# Patient Record
Sex: Female | Born: 1970 | Race: White | Hispanic: No | Marital: Married | Smoking: Former smoker
Health system: Southern US, Community
[De-identification: ages and names within clinical notes are randomized; demographics above are authoritative.]

## PROBLEM LIST (undated history)

## (undated) DIAGNOSIS — K5900 Constipation, unspecified: Secondary | ICD-10-CM

## (undated) DIAGNOSIS — R519 Headache, unspecified: Secondary | ICD-10-CM

## (undated) DIAGNOSIS — F419 Anxiety disorder, unspecified: Secondary | ICD-10-CM

## (undated) DIAGNOSIS — M722 Plantar fascial fibromatosis: Secondary | ICD-10-CM

## (undated) DIAGNOSIS — L719 Rosacea, unspecified: Secondary | ICD-10-CM

## (undated) HISTORY — PX: BACK SURGERY: SHX140

## (undated) HISTORY — DX: Headache, unspecified: R51.9

## (undated) HISTORY — DX: Constipation, unspecified: K59.00

## (undated) HISTORY — DX: Plantar fascial fibromatosis: M72.2

## (undated) HISTORY — PX: ANTERIOR FUSION LUMBAR SPINE: SUR629

## (undated) HISTORY — DX: Rosacea, unspecified: L71.9

## (undated) HISTORY — PX: COLONOSCOPY W/ POLYPECTOMY: SHX1380

## (undated) HISTORY — DX: Anxiety disorder, unspecified: F41.9

---

## 2013-10-22 ENCOUNTER — Ambulatory Visit (INDEPENDENT_AMBULATORY_CARE_PROVIDER_SITE_OTHER): Payer: Managed Care, Other (non HMO)

## 2013-10-22 VITALS — BP 143/82 | HR 86 | Resp 16 | Ht 68.0 in | Wt 158.0 lb

## 2013-10-22 DIAGNOSIS — M779 Enthesopathy, unspecified: Secondary | ICD-10-CM

## 2013-10-22 DIAGNOSIS — G5760 Lesion of plantar nerve, unspecified lower limb: Secondary | ICD-10-CM

## 2013-10-22 DIAGNOSIS — M722 Plantar fascial fibromatosis: Secondary | ICD-10-CM

## 2013-10-22 DIAGNOSIS — G609 Hereditary and idiopathic neuropathy, unspecified: Secondary | ICD-10-CM

## 2013-10-22 DIAGNOSIS — G629 Polyneuropathy, unspecified: Secondary | ICD-10-CM

## 2013-10-22 NOTE — Patient Instructions (Signed)

## 2013-10-22 NOTE — Progress Notes (Signed)
   Subjective:    Patient ID: Natalie Daniels, female    DOB: February 24, 1971, 43 y.o.   MRN: [id number]  HPI Comments: "I have this pain my in my foot"  Patient c/o sharp, electric-type pain plantar forefoot right, around 2nd MPJ, for about 1 month. The pain comes and goes. Can't walk barefoot. She has tried ice, Advil and there is a certain pair of shoes she can wear.     Review of Systems  HENT: Positive for sneezing.   All other systems reviewed and are negative.      Objective:   Physical Exam A objective findings as follows vascular status is intact pulses are palpable epicritic and proprioceptive sensations intact and symmetric bilateral there is normal plantar response and DTRs noted dermatologic the skin color pigment normal hair growth absent nails normal trophic. Orthopedic biomechanical exam there is a long second toe long second metatarsal noted radiographically. Epicritic and proprioceptive sensations are intact however there is hyperesthesia to the foot and there is generalize weakness to the toes she is unable to plantar flex her toes slightly dorsiflexed toes although right side is weaker than left patient had history 2 back surgeries and back problems which may contribute to peripheral neuropathy and weakness and neuritis are there is also pain on direct compression of the MTP joint and interspace second interspace right is exquisitely painful and tender on direct lateral compression consistent with early neuroma symptomology there is some capsulitis of the second MTP joint however is also significant pain on palpation the entire mid band and mid arch the plantar fascia right as well dermatologically unremarkable no other abnormal findings radiographically noted       Assessment & Plan:  Assessment this time is capsulitis second MTP joint with early neuroma symptomology second interspace right as was plantar fascial symptomology. However patient does have some digital deformities  and contractures due to possible muscle weakness and peripheral neuropathy associated with her a lumbar radiculopathy or back problems in the past. At this time injection tender with Kenalog in 20 oh Xylocaine plain infiltrated as a therapeutic nerve block for the Morton's neuroma second interspace fascial strapping applied to the right foot and patient will maintain ibuprofen anti-inflammatory is suggested ice to the heel and arch maintain a good stable shoes no barefoot or flimsy shoes or flip-flops reappointed 2-3 weeks may be candidate for orthoses based on progress  Alvan Dameichard Sikora DPM

## 2013-11-12 ENCOUNTER — Ambulatory Visit (INDEPENDENT_AMBULATORY_CARE_PROVIDER_SITE_OTHER): Payer: Managed Care, Other (non HMO)

## 2013-11-12 VITALS — BP 122/80 | HR 88 | Resp 16

## 2013-11-12 DIAGNOSIS — G629 Polyneuropathy, unspecified: Secondary | ICD-10-CM

## 2013-11-12 DIAGNOSIS — M779 Enthesopathy, unspecified: Secondary | ICD-10-CM

## 2013-11-12 DIAGNOSIS — G609 Hereditary and idiopathic neuropathy, unspecified: Secondary | ICD-10-CM

## 2013-11-12 DIAGNOSIS — G5760 Lesion of plantar nerve, unspecified lower limb: Secondary | ICD-10-CM

## 2013-11-12 DIAGNOSIS — M722 Plantar fascial fibromatosis: Secondary | ICD-10-CM

## 2013-11-12 NOTE — Progress Notes (Signed)
   Subjective:    Patient ID: Natalie Daniels, female    DOB: Jan 14, 1971, 43 y.o.   MRN: [id number]  HPI Comments: A lot better compared to when i first came in. It stills hurts with pressure on the forefoot      Review of Systems no new findings or systemic changes noted     Objective:   Physical Exam Neurovascular status is intact and unchanged pedal pedal pulses are palpable patient's injection present temporary relief but his back carrying not as bad as it had been originally the plantar fascial area improvement fascial strapping therefore would benefit from functional orthoses at this time however continues to capsulitis and neuroma symptomology the forefoot with semirigid digital contractures. No new changes the toes contracted dorsally and might benefit from functional orthoses with met pad to       Assessment & Plan:  Assessment plantar fasciitis/heel spur syndrome as well as capsulitis and Morton's neuroma symptomology improvement although not completely resolved would benefit from biomechanical support using orthoses orthotics skin carried out at this time for a functional orthoses with a met pad follow and use a Spenco top cover followup with him next month orthotic fitting and pick up and adjustments orthotic in the future as needed if fails to improve neuropsychology may warrant further injections either steroids or alcohol sclerosing injections based on progress  Harriet Masson DPM

## 2013-11-12 NOTE — Patient Instructions (Signed)

## 2013-12-03 ENCOUNTER — Ambulatory Visit (INDEPENDENT_AMBULATORY_CARE_PROVIDER_SITE_OTHER): Payer: Managed Care, Other (non HMO) | Admitting: *Deleted

## 2013-12-03 DIAGNOSIS — G609 Hereditary and idiopathic neuropathy, unspecified: Secondary | ICD-10-CM

## 2013-12-03 DIAGNOSIS — G629 Polyneuropathy, unspecified: Secondary | ICD-10-CM

## 2013-12-03 NOTE — Progress Notes (Signed)
   Subjective:    Patient ID: Natalie Daniels, female    DOB: 1970/12/06, 43 y.o.   MRN: [id number]  HPI  PICK UP ORTHOTICS AND GIVEN INSTRUCTION.    Review of Systems     Objective:   Physical Exam        Assessment & Plan:

## 2013-12-03 NOTE — Patient Instructions (Signed)

## 2013-12-06 ENCOUNTER — Other Ambulatory Visit: Payer: Managed Care, Other (non HMO)

## 2014-01-28 ENCOUNTER — Ambulatory Visit (INDEPENDENT_AMBULATORY_CARE_PROVIDER_SITE_OTHER): Payer: Managed Care, Other (non HMO)

## 2014-01-28 VITALS — BP 149/88 | HR 75 | Resp 15

## 2014-01-28 DIAGNOSIS — G5760 Lesion of plantar nerve, unspecified lower limb: Secondary | ICD-10-CM

## 2014-01-28 DIAGNOSIS — G5761 Lesion of plantar nerve, right lower limb: Secondary | ICD-10-CM

## 2014-01-28 DIAGNOSIS — M722 Plantar fascial fibromatosis: Secondary | ICD-10-CM

## 2014-01-28 DIAGNOSIS — G629 Polyneuropathy, unspecified: Secondary | ICD-10-CM

## 2014-01-28 DIAGNOSIS — M779 Enthesopathy, unspecified: Secondary | ICD-10-CM

## 2014-01-28 DIAGNOSIS — G609 Hereditary and idiopathic neuropathy, unspecified: Secondary | ICD-10-CM

## 2014-01-28 NOTE — Progress Notes (Signed)
   Subjective:    Patient ID: Natalie Daniels, female    DOB: 09-Mar-1971, 43 y.o.   MRN: [id number]  HPI Comments: Pt states she has worn her orthotics every waking moment since she got them and she is some better.  Pt is very concerned that she can't exercise.      Review of Systems no new findings or systemic changes     Objective:   Physical Exam Neurovascular status is intact pedal pulses palpable patient still having some occasional symptoms but not near as bad as it had been previously been wearing orthotics for about a month may have to still some slight tenderness occasionally in general greatly improved ambulating comfortably still not able do his in the classes ballistic activities however general walking and other activities and doing well the insoles fit and contour well       Assessment & Plan:  Assessment improving plantar fasciitis/heel spur syndrome as well as neuroma symptomology with biomechanical functional sports maintain orthotics continue with ice periodically or NSAIDs for any flareups or exacerbations patient is improved although not complete advised it may take anywhere from 1-3 months for complete resolution and improvement contact us on an as-needed basis if fails to resolve completely or for any adjustments or new orthoses in the future  Alvan Dameichard Dyland Panuco DPM

## 2014-01-28 NOTE — Patient Instructions (Signed)

## 2014-04-18 ENCOUNTER — Encounter: Payer: Self-pay | Admitting: Interventional Cardiology

## 2014-04-18 ENCOUNTER — Ambulatory Visit (INDEPENDENT_AMBULATORY_CARE_PROVIDER_SITE_OTHER): Payer: Managed Care, Other (non HMO) | Admitting: Interventional Cardiology

## 2014-04-18 VITALS — BP 140/84 | HR 83 | Ht 68.0 in | Wt 164.1 lb

## 2014-04-18 DIAGNOSIS — R079 Chest pain, unspecified: Secondary | ICD-10-CM

## 2014-04-18 NOTE — Progress Notes (Signed)
Patient ID: Natalie Daniels, female   DOB: [birth year], 43 y.o.   MRN: [id number]   Date: 04/18/2014 ID: Natalie MassedMelissa Daniels, DOB [birth year], MRN [id number] PCP: Warrick ParisianSTALLINGS,SHEILA, MD  Reason: chest pain  ASSESSMENT;  1. Brief pleuritic quality chest pain, has not recurred. Probably a pleural cath should 2. Labile blood pressure elevation, with borderline elevation today for age. 3. Minimal lipid elevation 4. Family history of CAD (paternal  PLAN:  1. Primary prevention with aerobic exercise, prudent diet, weight loss 2. If recurrent chest discomfort further evaluation with an echocardiogram should be considered to rule out pericardial disease 3. Clinical observation for now 4. Monitor blood pressure closely   SUBJECTIVE: Natalie Daniels is a 43 y.o. female who is for cardiac evaluation because of 2 episodes lasting less than a minute of severe pleuritic chest discomfort in the sternal area. Each episode occurred spontaneously and went away abruptly. While present in the discomfort kept the patient from taking a deep breath. Try to take a deep breath would markedly increase the intensity of the discomfort. There was no radiation of the discomfort. She denied dyspnea. No palpitations. Once the discomfort resolved she was able to get back and her typical activity without limitations. She has been active in [employer] both performed and after these episodes of discomfort and notices no change in her activity/tolerance.   Allergies  Allergen Reactions  . Aspirin Nausea And Vomiting  . Doxycycline Hives    Current Outpatient Prescriptions on File Prior to Visit  Medication Sig Dispense Refill  . Ascorbic Acid (VITAMIN C PO) Take by mouth.    . Cholecalciferol (VITAMIN D PO) Take 500 mg by mouth 3 (three) times daily.     Tery Sanfilippo. Docusate Calcium (STOOL SOFTENER PO) Take by mouth.    . Drospirenone-Ethinyl Estradiol (OCELLA PO) Take by mouth.    . Fexofenadine HCl (ALLEGRA PO) Take by mouth.    . Fluconazole  (DIFLUCAN PO) Take by mouth.    . Multiple Vitamin (MULTIVITAMIN) capsule Take 1 capsule by mouth daily.    . Omega-3 Fatty Acids (FISH OIL PO) Take by mouth.    . Probiotic Product (PROBIOTIC DAILY PO) Take by mouth.     No current facility-administered medications on file prior to visit.    Past Medical History  Diagnosis Date  . Rosacea     Past Surgical History  Procedure Laterality Date  . Anterior fusion lumbar spine    . Cesarean section    . Back surgery      History   Social History  . Marital Status: Married    Spouse Name: N/A    Number of Children: N/A  . Years of Education: N/A   Occupational History  . Not on file.   Social History Main Topics  . Smoking status: Never Smoker   . Smokeless tobacco: Not on file  . Alcohol Use: Yes  . Drug Use: Not on file  . Sexual Activity: Not on file   Other Topics Concern  . Not on file   Social History Narrative    Family History  Problem Relation Age of Onset  . Heart attack Father   . Heart attack Paternal Aunt   . Heart attack Paternal Grandfather     ROS: no orthopnea, PND, palpitations, neurological complaints, history of heart murmur, edema, or embolism, arrhythmia, or other complaint. Other systems negative for complaints.  OBJECTIVE: BP 140/84 mmHg  Pulse 83  Ht 5\' 8"  (1.727 m)  Wt 164 lb 1.9  oz (74.444 kg)  BMI 24.96 kg/m2,  General: No acute distress, healthy-appearing HEENT: normal no jaundice or pallor Neck: JVD flat. Carotids absent Chest: clear Cardiac: Murmur: absent. Gallop: none. Rhythm: normal. Other: none Abdomen: Bruit: absent. Pulsation: absent Extremities: Edema: normal. Pulses: 2+ and symmetric Neuro: normal Psych: normal  ECG: normal

## 2014-04-18 NOTE — Patient Instructions (Signed)
Your physician recommends that you continue on your current medications as directed. Please refer to the Current Medication list given to you today.   Your physician recommends that you schedule a follow-up appointment as needed  

## 2015-04-04 DIAGNOSIS — M722 Plantar fascial fibromatosis: Secondary | ICD-10-CM

## 2015-04-21 ENCOUNTER — Encounter: Payer: Self-pay | Admitting: *Deleted

## 2015-04-21 NOTE — Progress Notes (Signed)
Patient ID: Natalie MassedMelissa Blane, female   DOB: 22-Feb-1971, 44 y.o.   MRN: [id number] Patient came into office to pick up her 2nd pair of orthotics.

## 2015-11-17 DIAGNOSIS — J309 Allergic rhinitis, unspecified: Secondary | ICD-10-CM | POA: Insufficient documentation

## 2015-11-17 DIAGNOSIS — R42 Dizziness and giddiness: Secondary | ICD-10-CM | POA: Insufficient documentation

## 2015-11-17 DIAGNOSIS — Z87898 Personal history of other specified conditions: Secondary | ICD-10-CM | POA: Insufficient documentation

## 2015-11-28 ENCOUNTER — Other Ambulatory Visit: Payer: Self-pay | Admitting: Obstetrics & Gynecology

## 2015-11-28 DIAGNOSIS — R599 Enlarged lymph nodes, unspecified: Secondary | ICD-10-CM

## 2015-12-05 ENCOUNTER — Ambulatory Visit
Admission: RE | Admit: 2015-12-05 | Discharge: 2015-12-05 | Disposition: A | Discharge status: Home / Self Care | Payer: Managed Care, Other (non HMO) | Source: Ambulatory Visit | Attending: Obstetrics & Gynecology | Admitting: Obstetrics & Gynecology

## 2015-12-05 DIAGNOSIS — R599 Enlarged lymph nodes, unspecified: Secondary | ICD-10-CM

## 2016-04-01 LAB — HM PAP SMEAR: HM PAP: NEGATIVE

## 2016-09-27 ENCOUNTER — Encounter: Payer: Self-pay | Admitting: Podiatry

## 2016-09-27 ENCOUNTER — Ambulatory Visit (INDEPENDENT_AMBULATORY_CARE_PROVIDER_SITE_OTHER): Payer: 59

## 2016-09-27 ENCOUNTER — Ambulatory Visit (INDEPENDENT_AMBULATORY_CARE_PROVIDER_SITE_OTHER): Payer: 59 | Admitting: Podiatry

## 2016-09-27 DIAGNOSIS — M779 Enthesopathy, unspecified: Secondary | ICD-10-CM | POA: Diagnosis not present

## 2016-09-27 MED ORDER — DICLOFENAC SODIUM 75 MG PO TBEC: 75.0000 mg | DELAYED_RELEASE_TABLET | Freq: Two times a day (BID) | ORAL | 2 refills | Status: DC

## 2016-09-27 MED ORDER — TRIAMCINOLONE ACETONIDE 10 MG/ML IJ SUSP
10.0000 mg | Freq: Once | INTRAMUSCULAR | Status: AC
  Administered 2016-09-27: 10 mg

## 2016-09-29 NOTE — Progress Notes (Signed)
Subjective:     Patient ID: Natalie Daniels, female   DOB: Feb 15, 1971, 46 y.o.   MRN: [id number]  HPI patient presents with severe discomfort second MPJ left stating that it started to hurt her recently and she's not sure what might have happened   Review of Systems  All other systems reviewed and are negative.      Objective:   Physical Exam  Constitutional: She is oriented to person, place, and time.  Cardiovascular: Intact distal pulses.   Musculoskeletal: Normal range of motion.  Neurological: She is oriented to person, place, and time.  Skin: Skin is warm.  Nursing note and vitals reviewed.  neurovascular status intact muscle strength adequate range of motion within normal limits with inflammation fluid buildup around the second metatarsophalangeal joint left with pain when present and inability to walk with degree of comfort. Patient's found have good digital perfusion and is well oriented 3     Assessment:     Acute inflammatory capsulitis second MPJ left which may be due to injury with possibility for flexor plate injury    Plan:     H&P x-ray reviewed condition discussed. At this time we're to treat conservatively and I did a proximal nerve block of the area aspirated the joint was able to get out a small amount of clear fluid and I injected the joint quarter cc deck some Kenalog and applied thick pad to reduce pressure on the joint surface. Reappoint and understands ultimately this could be a surgical issue  X-rays did not indicate arthritis or stress fracture of the joint surface

## 2016-10-02 ENCOUNTER — Telehealth: Payer: Self-pay | Admitting: *Deleted

## 2016-10-02 NOTE — Telephone Encounter (Addendum)
Pt states Dr. Charlsie Merles performed a procedure on her foot 09/27/2016 and said it should be better in 24 hours, but she still has pain, behind the toes and ball of the foot. I told pt is most cases she would be better in 24 hours, but that depends on how inflamed the area was and how she was taking care of at home. I encouraged pt to ice 3-4 times daily for 15-20 minutes per session protecting the skin from the ice pack with fabric and to go in to a stiff bottom shoe to decrease the bending and continued irritation to the area. Pt states she is wearing the [employer] shoes Dr. Ralene Cork has said were better than some others he had seen. I asked pt if they were the same ones Dr. Ralene Cork had seen and she said yes. I told pt Dr. Ralene Cork had been gone about 3 years and they were probably too flexible for her needs now. Pt states understanding. I told pt to try the suggestions the rest of the week and weekend and let us know Monday if improved and if worsened call sooner.

## 2016-10-11 ENCOUNTER — Ambulatory Visit (INDEPENDENT_AMBULATORY_CARE_PROVIDER_SITE_OTHER): Payer: 59 | Admitting: Podiatry

## 2016-10-11 ENCOUNTER — Encounter: Payer: Self-pay | Admitting: Podiatry

## 2016-10-11 DIAGNOSIS — M779 Enthesopathy, unspecified: Secondary | ICD-10-CM

## 2016-10-11 MED ORDER — PREDNISONE 10 MG PO TABS: ORAL_TABLET | ORAL | 0 refills | Status: DC

## 2016-10-14 NOTE — Progress Notes (Signed)
Subjective:    Patient ID: Natalie Daniels, female   DOB: 46 y.o.   MRN: [id number]   HPI patient states she's improved some but continues to have discomfort on the left foot where she had a chronic inflammation of the joint    ROS      Objective:  Physical Exam Neurovascular status was noted to be intact with patient found to have inflammatory changes around the second MPJ left with fluid buildup around the joint and pain when palpated. Patient states that it's gradually gotten worse over that period of time and it's making it difficult for her to walk and while she got relief for the medication we utilized she still is having pain and prominence metatarsal    Assessment:  Inflammatory capsulitis of the second MPJ left secondary to probable dropping of the metatarsal with fluid buildup in the joint surface       Plan:     H&P condition reviewed. I recommended specific type orthotics and I did schedule her to see [facility] orthotist for orthotic treatment

## 2016-10-16 ENCOUNTER — Other Ambulatory Visit: Payer: 59

## 2016-10-17 ENCOUNTER — Other Ambulatory Visit: Payer: 59

## 2016-10-17 DIAGNOSIS — M7750 Other enthesopathy of unspecified foot: Secondary | ICD-10-CM

## 2016-11-07 ENCOUNTER — Other Ambulatory Visit: Payer: 59

## 2017-04-28 LAB — HM MAMMOGRAPHY

## 2017-07-03 ENCOUNTER — Ambulatory Visit (INDEPENDENT_AMBULATORY_CARE_PROVIDER_SITE_OTHER): Payer: 59

## 2017-07-03 ENCOUNTER — Ambulatory Visit (INDEPENDENT_AMBULATORY_CARE_PROVIDER_SITE_OTHER): Payer: 59 | Admitting: Physician Assistant

## 2017-07-03 ENCOUNTER — Encounter: Payer: Self-pay | Admitting: Physician Assistant

## 2017-07-03 VITALS — BP 130/82 | HR 83 | Ht 66.0 in | Wt 166.0 lb

## 2017-07-03 DIAGNOSIS — G8929 Other chronic pain: Secondary | ICD-10-CM | POA: Insufficient documentation

## 2017-07-03 DIAGNOSIS — Z23 Encounter for immunization: Secondary | ICD-10-CM | POA: Diagnosis not present

## 2017-07-03 DIAGNOSIS — Z87891 Personal history of nicotine dependence: Secondary | ICD-10-CM | POA: Insufficient documentation

## 2017-07-03 DIAGNOSIS — Z7689 Persons encountering health services in other specified circumstances: Secondary | ICD-10-CM | POA: Diagnosis not present

## 2017-07-03 DIAGNOSIS — M25561 Pain in right knee: Secondary | ICD-10-CM | POA: Diagnosis not present

## 2017-07-03 NOTE — Progress Notes (Signed)
[name],  Your x-ray was normal. I still recommend follow-up with Sports Medicine for your persistent knee pain.  It was nice meeting you! Vinetta Bergamoharley

## 2017-07-03 NOTE — Progress Notes (Signed)
HPI:                                                                Natalie Daniels is a 47 y.o. female [facility] Sports Medicine today to establish care  Current concerns include: right knee pain  Reports fell on her right knee in August, 5 months ago. Landed on her hardwood floor at home, had immediate bruising, swelling and pain.  States it took a couple of months to get better, but never returned to normal. States she was never able to return to [employer] and exercise classes. In the last month she re-injured it striking against a sliding door. Pain has returned, moderate, persistent worse with bending and squatting. She has tried Pentrax, Advil, ice/heat without much relief.  Depression screen PHQ 2/9 07/03/2017  Decreased Interest 0  Down, Depressed, Hopeless 0  PHQ - 2 Score 0    No flowsheet data found.    Past Medical History:  Diagnosis Date  . Constipation   . Plantar fasciitis   . Rosacea    Past Surgical History:  Procedure Laterality Date  . ANTERIOR FUSION LUMBAR SPINE    . BACK SURGERY    . CESAREAN SECTION    . COLONOSCOPY W/ POLYPECTOMY     age 47   Social History   Tobacco Use  . Smoking status: Former Smoker    Packs/day: 1.00    Years: 8.00    Pack years: 8.00    Types: Cigarettes    Last attempt to quit: 06/17/1994    Years since quitting: 23.0  . Smokeless tobacco: Never Used  Substance Use Topics  . Alcohol use: Yes    Comment: <1 month   family history includes Alcohol abuse in her father; Diabetes in her father; Endometriosis in her mother; Heart attack in her father, paternal aunt, and paternal grandfather; Stroke in her maternal grandfather; Thyroid disease in her mother.    ROS: negative except as noted in the HPI  Medications: Current Outpatient Medications  Medication Sig Dispense Refill  . Ascorbic Acid (VITAMIN C PO) Take by mouth.    Tery Sanfilippo. Docusate Calcium (STOOL SOFTENER PO) Take by  mouth.    . Drospirenone-Ethinyl Estradiol (OCELLA PO) Take by mouth.    . metroNIDAZOLE (METROGEL) 1 % gel metronidazole 1 % topical gel    . Multiple Vitamin (MULTIVITAMIN) capsule Take 1 capsule by mouth daily.    . Omega-3 Fatty Acids (FISH OIL PO) Take by mouth.    . Probiotic Product (PROBIOTIC DAILY PO) Take by mouth.     No current facility-administered medications for this visit.    Allergies  Allergen Reactions  . Aspirin Nausea And Vomiting  . Diclofenac Hives and Itching  . Doxycycline Hives       Objective:  BP 130/82   Pulse 83   Ht 5\' 6"  (1.676 m)   Wt 166 lb (75.3 kg)   SpO2 100%   BMI 26.79 kg/m  Gen:  alert, not ill-appearing, no distress, appropriate for age HEENT: head normocephalic without obvious abnormality, conjunctiva and cornea clear, wearing glasses, trachea midline Pulm: Normal work of breathing, normal phonation Neuro: alert and oriented x 3, no tremor MSK: right knee atraumatic, mild  swelling of the lateral joint line, full active ROM, no crepitus, no effusion, joint stable, negative anterior drawer; normal gait and station Skin: intact, no rashes on exposed skin, no jaundice, no cyanosis Psych: well-groomed, cooperative, good eye contact, euthymic mood, affect mood-congruent, speech is articulate, and thought processes clear and goal-directed    No results found for this or any previous visit (from the past 72 hour(s)). No results found.    Assessment and Plan: 47 y.o. female with   1. Encounter to establish care - reviewed PMH, PSH, PFH, medications and allergies - reviewed health maintenance - Pap smear and Mammogram UTD per patient, requesting records from [facility] - Tdap due - influenza UTD - negative PHQ2  2. Chronic pain of right knee - persistent knee pain after mild trauma, not responsive to NSAIDs or rest. Obtaining X-ray today and referring to Sports Medicine for further evaluation and management - DG Knee Complete 4  Views Right  3. Need for Tdap vaccination - Tdap vaccine greater than or equal to 7yo IM  Patient education and anticipatory guidance given Patient agrees with treatment plan Follow-up as needed if symptoms worsen or fail to improve  Levonne Hubert PA-C

## 2017-07-08 ENCOUNTER — Ambulatory Visit (INDEPENDENT_AMBULATORY_CARE_PROVIDER_SITE_OTHER): Payer: 59 | Admitting: Family Medicine

## 2017-07-08 ENCOUNTER — Encounter: Payer: Self-pay | Admitting: Family Medicine

## 2017-07-08 VITALS — BP 136/86 | HR 84 | Ht 68.0 in | Wt 165.0 lb

## 2017-07-08 DIAGNOSIS — G8929 Other chronic pain: Secondary | ICD-10-CM

## 2017-07-08 DIAGNOSIS — M25561 Pain in right knee: Secondary | ICD-10-CM

## 2017-07-08 DIAGNOSIS — M222X1 Patellofemoral disorders, right knee: Secondary | ICD-10-CM | POA: Diagnosis not present

## 2017-07-08 MED ORDER — DICLOFENAC SODIUM 1 % TD GEL: 4.0000 g | Freq: Four times a day (QID) | TRANSDERMAL | 11 refills | Status: DC

## 2017-07-08 NOTE — Progress Notes (Signed)
Subjective:    I'm seeing this patient as a consultation for:  Natalie Fray PA-C  CC: R knee pain  HPI:  Patient with 1 1/2 year history of right knee pain. Patient fell on knee 1.5 years ago and has since had dull achy pain since that time. Pain is worse with both sitting in one position, such as when driving care, and with exertion. Pain is worse after day of activity. Worsens with walking down steps or off cub. Occasional shooting pain at night. Patient has tried advil with minimal relief. Recent Xray of knee shows no DJD, joint space narrowing, or bony abnormality. Patient goal of care is to return to fitness class, which she has not been able to participate in since injury.   Past medical history, Surgical history, Family history not pertinant except as noted below, Social history, Allergies, and medications have been entered into the medical record, reviewed, and no changes needed.   Review of Systems: No headache, visual changes, nausea, vomiting, diarrhea, constipation, dizziness, abdominal pain, skin rash, fevers, chills, night sweats, weight loss, swollen lymph nodes, body aches, joint swelling, muscle aches, chest pain, shortness of breath, mood changes, visual or auditory hallucinations.   Objective:    Vitals:   07/08/17 0920  BP: 136/86  Pulse: 84   General: Well Developed, well nourished, and in no acute distress.  Neuro/Psych: Alert and oriented x3, extra-ocular muscles intact, able to move all 4 extremities, sensation grossly intact. Skin: Warm and dry, no rashes noted.  Respiratory: Not using accessory muscles, speaking in full sentences, trachea midline.  Cardiovascular: Pulses palpable, no extremity edema. Abdomen: Does not appear distended. MSK:  Right Knee: Normal appearing with no effusion or erythema Range of motion 0-120  Tender to palpation medial joint line.  Tender to palpation overlying the quad and patellar tendons with squeak overlying the distal  quadriceps tendon. Stable ligamentous exam Positive medial McMurray's test Extension strength is slightly decreased 4+/5 compared to left Normal flexion strength Normal gait  CLINICAL DATA:  Trauma to the right knee in August and November of 2018 with persistent right knee pain.  EXAM: RIGHT KNEE - COMPLETE 4+ VIEW  COMPARISON:  None.  FINDINGS: No evidence of fracture, dislocation, or joint effusion. No evidence of arthropathy or other focal bone abnormality. Soft tissues are unremarkable.  IMPRESSION: Negative.   Electronically Signed   By: Sherian Rein M.D.   On: 07/03/2017 13:49  Impression and Recommendations:    Assessment and Plan: 47 y.o. female with 1 1/2 history of right knee pain. Patient seen as consultation of Natalie Daniels, after failure of conservative management. Patient's signs and symptoms most consistent with 1) patellofemoral syndrome of right knee and 2) possible medial meniscus tear.  Patient has had greater than 6 weeks of conservative management.  For the patellofemoral pain, patient with benefit from topical diclofenac gel and strengthening exercises for quadriceps and hip abductors.  For possible meniscus tear, MRI is warranted. Patient has signs and symptoms concerning for meniscus tear including pain along medial joint line and pain along medial joint line with external rotation of foot and ankle.   Orders Placed This Encounter  Procedures  . MR Knee Right Wo Contrast    Standing Status:   Future    Standing Expiration Date:   09/06/2018    Order Specific Question:   What is the patient's sedation requirement?    Answer:   No Sedation    Order Specific Question:  Does the patient have a pacemaker or implanted devices?    Answer:   No    Order Specific Question:   Preferred imaging location?    [facility] (table limit-[facility]lbs[facility]    Order Specific Question:   Radiology Contrast Protocol - do NOT remove file path     Answer:   [email or web address]   Meds ordered this encounter  Medications  . diclofenac sodium (VOLTAREN) 1 % GEL    Sig: Apply 4 g topically 4 (four) times daily. To affected joint.    Dispense:  100 g    Refill:  11    Discussed warning signs or symptoms. Please see discharge instructions. Patient expresses understanding.

## 2017-07-08 NOTE — Patient Instructions (Signed)
Thank you for coming in today. Get MRI soon.  Start straight leg raises, Toe out straight leg raises and side leg raises.  Start using sets of 15 reps 2-3x daily.  Increase to 30 reps then increase to a 2 pound leg weight.  Do both sides.  Recheck with me a few days after the MRI.    Patellar Tendinitis Rehab Ask your health care provider which exercises are safe for you. Do exercises exactly as told by your health care provider and adjust them as directed. It is normal to feel mild stretching, pulling, tightness, or discomfort as you do these exercises, but you should stop right away if you feel sudden pain or your pain gets worse.Do not begin these exercises until told by your health care provider. Stretching and range of motion exercises This exercise warms up your muscles and joints and improves the movement and flexibility of your knee. This exercise also helps to relieve pain and stiffness. Exercise A: Hamstring, doorway  1. Lie on your back in front of a doorway with your __________ leg resting against the wall and your other leg flat on the floor in the doorway. There should be a slight bend in your __________ knee. 2. Straighten your __________ knee. You should feel a stretch behind your knee or thigh. If you do not, scoot your buttocks closer to the door. 3. Hold this position for __________ seconds. Repeat __________ times. Complete this stretch __________ times a day. Strengthening exercises These exercises build strength and endurance in your knee. Endurance is the ability to use your muscles for a long time, even after they get tired. Exercise B: Quadriceps, isometric  1. Lie on your back with your __________ leg extended and your other knee bent. 2. Slowly tense the muscles in the front of your __________ thigh. When you do this, you should see your kneecap slide up toward your hip or see increased dimpling just above the knee. This motion will push the back of your knee  toward the floor. If this is painful, try putting a rolled-up hand towel under your knee to support it in a bent position. Change the size of the towel to find a position that allows you to do this exercise without any pain. 3. For __________ seconds, hold the muscle as tight as you can without increasing your pain. 4. Relax the muscles slowly and completely. Repeat __________ times. Complete this exercise __________ times a day. Exercise C: Straight leg raises ( quadriceps) 1. Lie on your back with your __________ leg extended and your other knee bent. 2. Tense the muscles in the front of your __________ thigh. When you do this, you should see your kneecap slide up or see increased dimpling just above the knee. 3. Keep these muscles tight as you raise your leg 4-6 inches (10-15 cm) off the floor. Do not let your moving knee bend. 4. Hold this position for __________ seconds. 5. Keep these muscles tense as you slowly lower your leg. 6. Relax your muscles slowly and completely. Repeat __________ times. Complete this exercise __________ times a day. Exercise D: Squats 1. Stand in front of a table, with your feet and knees pointing straight ahead. You may rest your hands on the table for balance but not for support. 2. Slowly bend your knees and lower your hips like you are going to sit in a chair. ? Keep your weight over your heels, not over your toes. ? Keep your lower legs upright so  they are parallel with the table legs. ? Do not let your hips go lower than your knees. ? Do not bend lower than told by your health care provider. ? If your knee pain increases, do not bend as low. 3. Hold the squat position for __________ seconds. 4. Slowly push with your legs to return to standing. Do not use your hands to pull yourself to standing. Repeat __________ times. Complete this exercise __________ times a day. Exercise E: Step-downs 1. Stand on the edge of a step. 2. Keeping your weight over your  __________ heel, slowly bend your __________ knee to bring your __________ heel toward the floor. Lower your heel as far as you can while keeping control and without increasing any discomfort. ? Do not let your __________ knee come forward. ? Use your leg muscles, not gravity, to lower your body. ? Hold a wall or rail for balance if needed. 3. Slowly push through your heel to lift your body weight back up. 4. Return to the starting position. Repeat __________ times. Complete this exercise __________ times a day. Exercise F: Straight leg raises ( hip abductors) 1. Lie on your side with your __________ leg in the top position. Lie so your head, shoulder, knee, and hip line up. You may bend your lower knee to help you keep your balance. 2. Roll your hips slightly forward, so that your hips are stacked directly over each other and your __________ knee is facing forward. 3. Leading with your heel, lift your top leg 4-6 inches (10-15 cm). You should feel the muscles in your outer hip lifting. ? Do not let your foot drift forward. ? Do not let your knee roll toward the ceiling. 4. Hold this position for __________ seconds. 5. Slowly lower your leg to the starting position. 6. Let your muscles relax completely after each repetition. Repeat __________ times. Complete this exercise __________ times a day. This information is not intended to replace advice given to you by your health care provider. Make sure you discuss any questions you have with your health care provider. Document Released: 06/03/2005 Document Revised: 02/08/2016 Document Reviewed: 03/07/2015 Elsevier Interactive Patient Education  2018 [employer] Inc.    Meniscus Tear A meniscus tear is a knee injury in which a piece of the meniscus is torn. The meniscus is a thick, rubbery, wedge-shaped cartilage in the knee. Two menisci are located in each knee. They sit between the upper bone (femur) and lower bone (tibia) that make up the knee  joint. Each meniscus acts as a shock absorber for the knee. A torn meniscus is one of the most common types of knee injuries. This injury can range from mild to severe. Surgery may be needed for a severe tear. What are the causes? This injury may be caused by any squatting, twisting, or pivoting movement. Sports-related injuries are the most common cause. These often occur from:  Running and stopping suddenly.  Changing direction.  Being tackled or knocked off your feet.  As people get older, their meniscus gets thinner and weaker. In these people, tears can happen more easily, such as from climbing stairs. What increases the risk? This injury is more likely to happen to:  People who play contact sports.  Males.  People who are 76?47 years of age.  What are the signs or symptoms? Symptoms of this injury include:  Knee pain, especially at the side of the knee joint. You may feel pain when the injury occurs, or you may  only hear a pop and feel pain later.  A feeling that your knee is clicking, catching, locking, or giving way.  Not being able to fully bend or extend your knee.  Bruising or swelling in your knee.  How is this diagnosed? This injury may be diagnosed based on your symptoms and a physical exam. The physical exam may include:  Moving your knee in different ways.  Feeling for tenderness.  Listening for a clicking sound.  Checking if your knee locks or catches.  You may also have tests, such as:  X-rays.  MRI.  A procedure to look inside your knee with a narrow surgical telescope (arthroscopy).  You may be referred to a knee specialist (orthopedic surgeon). How is this treated? Treatment for this injury depends on the severity of the tear. Treatment for a mild tear may include:  Rest.  Medicine to reduce pain and swelling. This is usually a nonsteroidal anti-inflammatory drug (NSAID).  A knee brace or an elastic sleeve or wrap.  Using crutches or a  walker to keep weight off your knee and to help you walk.  Exercises to strengthen your knee (physical therapy).  You may need surgery if you have a severe tear or if other treatments are not working. Follow these instructions at home: Managing pain and swelling  Take over-the-counter and prescription medicines only as told by your health care provider.  If directed, apply ice to the injured area: ? Put ice in a plastic bag. ? Place a towel between your skin and the bag. ? Leave the ice on for 20 minutes, 2-3 times per day.  Raise (elevate) the injured area above the level of your heart while you are sitting or lying down. Activity  Do not use the injured limb to support your body weight until your health care provider says that you can. Use crutches or a walker as told by your health care provider.  Return to your normal activities as told by your health care provider. Ask your health care provider what activities are safe for you.  Perform range-of-motion exercises only as told by your health care provider.  Begin doing exercises to strengthen your knee and leg muscles only as told by your health care provider. After you recover, your health care provider may recommend these exercises to help prevent another injury. General instructions  Use a knee brace or elastic wrap as told by your health care provider.  Keep all follow-up visits as told by your health care provider. This is important. Contact a health care provider if:  You have a fever.  Your knee becomes red, tender, or swollen.  Your pain medicine is not helping.  Your symptoms get worse or do not improve after 2 weeks of home care. This information is not intended to replace advice given to you by your health care provider. Make sure you discuss any questions you have with your health care provider. Document Released: 08/24/2002 Document Revised: 11/09/2015 Document Reviewed: 09/26/2014 Elsevier Interactive Patient  Education  [employer].

## 2017-07-08 NOTE — Progress Notes (Signed)
[facility]

## 2017-07-11 ENCOUNTER — Encounter: Payer: Self-pay | Admitting: Physician Assistant

## 2017-07-14 ENCOUNTER — Ambulatory Visit (INDEPENDENT_AMBULATORY_CARE_PROVIDER_SITE_OTHER): Payer: 59

## 2017-07-14 DIAGNOSIS — G8929 Other chronic pain: Secondary | ICD-10-CM

## 2017-07-14 DIAGNOSIS — M25561 Pain in right knee: Secondary | ICD-10-CM

## 2017-07-29 ENCOUNTER — Ambulatory Visit (INDEPENDENT_AMBULATORY_CARE_PROVIDER_SITE_OTHER): Payer: 59 | Admitting: Family Medicine

## 2017-07-29 ENCOUNTER — Encounter: Payer: Self-pay | Admitting: Family Medicine

## 2017-07-29 DIAGNOSIS — M222X1 Patellofemoral disorders, right knee: Secondary | ICD-10-CM | POA: Diagnosis not present

## 2017-07-29 DIAGNOSIS — M222X9 Patellofemoral disorders, unspecified knee: Secondary | ICD-10-CM | POA: Insufficient documentation

## 2017-07-29 NOTE — Progress Notes (Signed)
Natalie Daniels is a 47 y.o. female who presents to [facility] today for follow up on knee pain.  Patient seen 07/08/17 for 1.5 year history of right knee pain, worsening over the past month. Patient has significantly improved since that time. Patient states that she is using diclofenac gel 4 times per day and it helps the pain significantly. Patient able to ambulate with much less pain than prior. States that pain is 3/10, which is significantly improved from prior. Patient does note that home leg raise exercises were intolerable. She performed them faithfully for several weeks, but the pain became intolerable so she elected to stop. MRI of right knee 1/29 was completely normal, not concerning for medial meniscus injury.   At this point, patient is satisfied with her progress. Wondering if she can return to fitness class. Patient will try to return to exercise on limited basis. Doesn't think she's at the point of needing formal physical therapy.    Past Medical History:  Diagnosis Date  . Constipation   . Plantar fasciitis   . Rosacea    Past Surgical History:  Procedure Laterality Date  . ANTERIOR FUSION LUMBAR SPINE    . BACK SURGERY    . CESAREAN SECTION    . COLONOSCOPY W/ POLYPECTOMY     age 47   Social History   Tobacco Use  . Smoking status: Former Smoker    Packs/day: 1.00    Years: 8.00    Pack years: 8.00    Types: Cigarettes    Last attempt to quit: 06/17/1994    Years since quitting: 23.1  . Smokeless tobacco: Never Used  Substance Use Topics  . Alcohol use: Yes    Comment: <1 month     ROS:  As above   Medications: Current Outpatient Medications  Medication Sig Dispense Refill  . Ascorbic Acid (VITAMIN C PO) Take by mouth.    . diclofenac sodium (VOLTAREN) 1 % GEL Apply 4 g topically 4 (four) times daily. To affected joint. 100 g 11  . Docusate Calcium (STOOL SOFTENER PO) Take by mouth.    .  Drospirenone-Ethinyl Estradiol (OCELLA PO) Take by mouth.    . metroNIDAZOLE (METROGEL) 1 % gel metronidazole 1 % topical gel    . Multiple Vitamin (MULTIVITAMIN) capsule Take 1 capsule by mouth daily.    . Omega-3 Fatty Acids (FISH OIL PO) Take by mouth.    . Probiotic Product (PROBIOTIC DAILY PO) Take by mouth.     No current facility-administered medications for this visit.    Allergies  Allergen Reactions  . Aspirin Nausea And Vomiting  . Diclofenac Hives and Itching  . Doxycycline Hives     Exam:  BP 111/73 (BP Location: Left Arm, Patient Position: Sitting, Cuff Size: Large)   Pulse 84   Temp 99.5 F (37.5 C) (Oral)   Resp 16   Wt 167 lb 4.8 oz (75.9 kg)   SpO2 100%   BMI 25.44 kg/m  General: Well Developed, well nourished, and in no acute distress.  Neuro/Psych: Alert and oriented x3, extra-ocular muscles intact, able to move all 4 extremities, sensation grossly intact. Skin: Warm and dry, no rashes noted.  Respiratory: Not using accessory muscles, speaking in full sentences, trachea midline.  Cardiovascular: Pulses palpable, no extremity edema. Abdomen: Does not appear distended. MSK:  R. Knee: Held in straight position for comfort.  Significant tenderness along quadriceps tendon.  Minimal tenderness along medial joint line.  MRI  images independently reviewed by me CLINICAL DATA:  Chronic right knee pain.  EXAM: MRI OF THE RIGHT KNEE WITHOUT CONTRAST  TECHNIQUE: Multiplanar, multisequence MR imaging of the knee was performed. No intravenous contrast was administered.  COMPARISON:  Radiographs dated 07/03/2017  FINDINGS: MENISCI  Medial meniscus:  Normal.  Lateral meniscus:  Normal.  LIGAMENTS  Cruciates:  Normal.  Collaterals:  Normal.  CARTILAGE  Patellofemoral:  Normal.  Medial:  Normal.  Lateral:  Normal.  Joint:  No joint effusion. Normal Hoffa's fat. No plical thickening.  Popliteal Fossa:  No Baker cyst. Intact  popliteus tendon.  Extensor Mechanism:  Normal.  Bones:  Normal.  Other: None  IMPRESSION: Normal MRI of the right knee.   Electronically Signed   By: Francene Boyers M.D.   On: 07/14/2017 09:14  Assessment and Plan: 47 y.o. female with patellofemoral syndrome. Patient with normal MRI and improving anteromedial knee pain. Pain most consistent with patellofemoral syndrome.   Patient will continue diclofenac gel as it has been extremely helpful. Patient does not wish for formal physical therapy at this point. She can return to fitness class on limited basis, increasing activity as tolerated. If pain return with Zumba next step would be referral to PT.   Patient only needs to return to follow up if not improving or worsening.   I spent 15 minutes with this patient, greater than 50% was face-to-face time counseling regarding ddx and treatment plan and review MRI.  Discussed warning signs or symptoms. Please see discharge instructions. Patient expresses understanding.

## 2017-07-29 NOTE — Patient Instructions (Addendum)
Thank you for coming in today. Continue Diclofenac gel as needed.  If not doing well the next step is PT.  Resume Zumba at about 50% and increase about 10% per week.  Recheck with me as needed.    Patellofemoral Pain Syndrome Patellofemoral pain syndrome is a condition that involves a softening or breakdown of the tissue (cartilage) on the underside of your kneecap (patella). This causes pain in the front of the knee. The condition is also called runner's knee or chondromalacia patella. Patellofemoral pain syndrome is most common in young adults who are active in sports. Your knee is the largest joint in your body. The patella covers the front of your knee and is attached to muscles above and below your knee. The underside of the patella is covered with a smooth type of cartilage (synovium). The smooth surface helps the patella glide easily when you move your knee. Patellofemoral pain syndrome causes swelling in the joint linings and bone surfaces in your knee. What are the causes? Patellofemoral pain syndrome can be caused by:  Overuse.  Poor alignment of your knee joints.  Weak leg muscles.  A direct blow to your kneecap.  What increases the risk? You may be at risk for patellofemoral pain syndrome if you:  Do a lot of activities that can wear down your kneecap. These include: ? Running. ? Squatting. ? Climbing stairs.  Start a new physical activity or exercise program.  Wear shoes that do not fit well.  Do not have good leg strength.  Are overweight.  What are the signs or symptoms? Knee pain is the most common symptom of patellofemoral pain syndrome. This may feel like a dull, aching pain underneath your patella, in the front of your knee. There may be a popping or cracking sound when you move your knee. Pain may get worse with:  Exercise.  Climbing stairs.  Running.  Jumping.  Squatting.  Kneeling.  Sitting for a long time.  Moving or pushing on your  patella.  How is this diagnosed? Your health care provider may be able to diagnose patellofemoral pain syndrome from your symptoms and medical history. You may be asked about your recent physical activities and which ones cause knee pain. Your health care provider may do a physical exam with certain tests to confirm the diagnosis. These may include:  Moving your patella back and forth.  Checking your range of knee motion.  Having you squat or jump to see if you have pain.  Checking the strength of your leg muscles.  An MRI of the knee may also be done. How is this treated? Patellofemoral pain syndrome can usually be treated at home with rest, ice, compression, and elevation (RICE). Other treatments may include:  Nonsteroidal anti-inflammatory drugs (NSAIDs).  Physical therapy to stretch and strengthen your leg muscles.  Shoe inserts (orthotics) to take stress off your knee.  A knee brace or knee support.  Surgery to remove damaged cartilage or move the patella to a better position. The need for surgery is rare.  Follow these instructions at home:  Take medicines only as directed by your health care provider.  Rest your knee. ? When resting, keep your knee raised above the level of your heart. ? Avoid activities that cause knee pain.  Apply ice to the injured area: ? Put ice in a plastic bag. ? Place a towel between your skin and the bag. ? Leave the ice on for 20 minutes, 2-3 times a day.  Use splints, braces, knee supports, or walking aids as directed by your health care provider.  Perform stretching and strengthening exercises as directed by your health care provider or physical therapist.  Keep all follow-up visits as directed by your health care provider. This is important. Contact a health care provider if:  Your symptoms get worse.  You are not improving with home care. This information is not intended to replace advice given to you by your health care  provider. Make sure you discuss any questions you have with your health care provider. Document Released: 05/22/2009 Document Revised: 11/09/2015 Document Reviewed: 08/23/2013 Elsevier Interactive Patient Education  2018 [employer]

## 2017-08-05 ENCOUNTER — Encounter: Payer: Self-pay | Admitting: Family Medicine

## 2017-08-05 DIAGNOSIS — M222X1 Patellofemoral disorders, right knee: Secondary | ICD-10-CM

## 2017-08-06 MED ORDER — DICLOFENAC SODIUM 1 % TD GEL: 4.0000 g | Freq: Four times a day (QID) | TRANSDERMAL | 11 refills | Status: DC

## 2017-08-06 NOTE — Addendum Note (Signed)
Addended by: Rodolph BongOREY, Travell Desaulniers S on: 08/06/2017 06:59 AM   Modules accepted: Orders

## 2017-08-07 MED ORDER — DICLOFENAC SODIUM 1 % TD GEL: 4.0000 g | Freq: Four times a day (QID) | TRANSDERMAL | 3 refills | Status: DC

## 2017-08-07 NOTE — Addendum Note (Signed)
Addended by: Rodolph BongOREY, EVAN S on: 08/07/2017 08:35 PM   Modules accepted: Orders

## 2017-08-14 ENCOUNTER — Telehealth: Payer: Self-pay | Admitting: Family Medicine

## 2017-08-14 MED ORDER — DICLOFENAC SODIUM 1 % TD GEL: 4.0000 g | Freq: Four times a day (QID) | TRANSDERMAL | 3 refills | Status: DC

## 2017-08-14 NOTE — Telephone Encounter (Signed)
Voltaren gel prescription again corrected.  Apparently the plan limits the max daily dose to 6g per day. So the 90 day supply is 540g.  [facility]ex rx sent [facility].

## 2017-08-14 NOTE — Telephone Encounter (Signed)
PATIENT HAS BEEN INFORMED. Natalie Daniels,CMA  

## 2017-08-28 ENCOUNTER — Encounter: Payer: Self-pay | Admitting: Rehabilitative and Restorative Service Providers"

## 2017-08-28 ENCOUNTER — Ambulatory Visit: Payer: 59 | Admitting: Rehabilitative and Restorative Service Providers"

## 2017-08-28 DIAGNOSIS — M25561 Pain in right knee: Secondary | ICD-10-CM | POA: Diagnosis not present

## 2017-08-28 DIAGNOSIS — R29898 Other symptoms and signs involving the musculoskeletal system: Secondary | ICD-10-CM | POA: Diagnosis not present

## 2017-08-28 DIAGNOSIS — R2689 Other abnormalities of gait and mobility: Secondary | ICD-10-CM

## 2017-08-28 NOTE — Patient Instructions (Signed)
HIP: Hamstrings - Supine  Place strap around foot. Raise leg up, keeping knee straight.  Bend opposite knee to protect back if indicated. Hold 30 seconds. 3 reps per set, 2-3 sets per day  Outer Hip Stretch: Reclined IT Band Stretch (Strap)   Strap around one foot, pull leg across body until you feel a pull or stretch in the outside of your hip, with shoulders on mat. Hold for 30 seconds. Repeat 3 times each leg. 2-3 times/day.  Piriformis Stretch   Lying on back, pull right knee toward opposite shoulder. Hold 30 seconds. Repeat 3 times. Do 2-3 sessions per day.   Calf Stretch    Place hands on wall at shoulder height. Keeping back leg straight, bend front leg, feet pointing forward, heels flat on floor. Lean forward slightly until stretch is felt in calf of back leg. Hold stretch __30_ seconds, breathing slowly in and out. Repeat stretch with other leg back. Do _3__ sessions per day. Variation: Use chair or table for support.  Quads / HF, Supine   Lie near edge of bed, pull both knees up toward chest. Hold one knee as you drop the other leg off the edge of the bed.  Relax hanging knee/can bend knee back if indicated. Hold 30 seconds. Repeat 3 times per session. Do 2-3 sessions per day.  Quads / HF, Prone KNEE: Quadriceps - Prone    Place strap around ankle. Bring ankle toward buttocks. Press hip into surface. Hold 30 seconds. Repeat 3 times per session. Do 2-3 sessions per day.  Kinesiology tape What is kinesiology tape?  There are many brands of kinesiology tape.  KTape, [employer], [employer], Dynamic tape, to name a few. It is an elasticized tape designed to support the body's natural healing process. This tape provides stability and support to muscles and joints without restricting motion. It can also help decrease swelling in the area of application. How does it work? The tape microscopically lifts and decompresses the skin to allow for drainage of lymph (swelling)  to flow away from area, reducing inflammation.  The tape has the ability to help re-educate the neuromuscular system by targeting specific receptors in the skin.  The presence of the tape increases the body's awareness of posture and body mechanics.  Do not use with: . Open wounds . Skin lesions . Adhesive allergies Safe removal of the tape: In some rare cases, mild/moderate skin irritation can occur.  This can include redness, itchiness, or hives. If this occurs, immediately remove tape and consult your primary care physician if symptoms are severe or do not resolve within 2 days.  To remove tape safely, hold nearby skin with one hand and gentle roll tape down with other hand.  You can apply oil or conditioner to tape while in shower prior to removal to loosen adhesive.  DO NOT swiftly rip tape off like a band-aid, as this could cause skin tears and additional skin irritation.

## 2017-08-28 NOTE — Therapy (Signed)
[facility] [place] [address] [place] [address] [place], [place], [address] Phone: [phone]   Fax:  [phone]  Physical Therapy Evaluation  Patient Details  Name: Natalie Daniels MRN: [id number] Date of Birth: [birth year] Referring Provider: Dr Denyse Amass    Encounter Date: 08/28/2017  PT End of Session - 08/28/17 1658    Visit Number  1    Number of Visits  12    Date for PT Re-Evaluation  10/11/17    PT Start Time  1658    PT Stop Time  1755    PT Time Calculation (min)  57 min    Activity Tolerance  Patient tolerated treatment well       Past Medical History:  Diagnosis Date  . Constipation   . Plantar fasciitis   . Rosacea     Past Surgical History:  Procedure Laterality Date  . ANTERIOR FUSION LUMBAR SPINE    . BACK SURGERY    . CESAREAN SECTION    . COLONOSCOPY W/ POLYPECTOMY     age 14    There were no vitals filed for this visit.   Subjective Assessment - 08/28/17 1701    Subjective  Patient reports that she tripped over a ladder 8/17 and hit the Rt knee. Symptoms improved until she 11/18 whe she struck Rt knee on a sliding door. She has had painin the Rt knee since that time. She feels the knee sometimes "gives out" and it hurts.    Pertinent History  LBP - fusion of L4/5 ~ 13 yrs ago    How long can you sit comfortably?  30 min     How long can you stand comfortably?  15 min     How long can you walk comfortably?  not at all     Diagnostic tests  xrays; MRI WNL's     Patient Stated Goals  get rid of the Rt knee pain; walk without pain and return to zumba     Currently in Pain?  Yes    Pain Score  4     Pain Location  Knee    Pain Orientation  Right    Pain Descriptors / Indicators  Aching;Sharp sharp at times    Pain Type  Acute pain;Chronic pain    Pain Radiating Towards  into anterior thigh 1/2 toward hip     Pain Onset  More than a month ago    Pain Frequency  Intermittent    Aggravating Factors   prolonged  sitting, standing, walking; ascending/descending steps; squats; bending;     Pain Relieving Factors  meds          [facility] PT Assessment - 08/28/17 0001      Assessment   Medical Diagnosis  Rt patellofemoral knee pain     Referring Provider  Dr Denyse Amass     Onset Date/Surgical Date  05/10/17    Hand Dominance  Right    Next MD Visit  PRN     Prior Therapy  none for knee       Precautions   Precautions  None      Balance Screen   Has the patient fallen in the past 6 months  No    Has the patient had a decrease in activity level because of a fear of falling?   No    Is the patient reluctant to leave their home because of a fear of falling?   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     Leisure  household chores; exercise zumba 3 x/wk; hiking; bike riding before injury 8/17      Observation/Other Assessments   Focus on Therapeutic Outcomes (FOTO)   62% limitation       Sensation   Additional Comments  WFL's per pt report       Posture/Postural Control   Posture Comments  weight equal oon both LE's       AROM   Right/Left Hip  -- WNL's bilat     Right/Left Knee  -- WNL's bilat discomfort end range flexion     Right/Left Ankle  -- tight Lt > Rt DF      Strength   Right/Left Hip  -- 5/5 bilat LE's     Right/Left Knee  -- 5/5 bilat LE's     Right/Left Ankle  -- PF Lt 4+/5; Rt 5-/5       Flexibility   Hamstrings  tight bilat ~ 75 deg     Quadriceps  tight Rt 120 deg; Lt 107 deg    ITB  tight Rt > Lt     Piriformis  tight Rt > Lt       Palpation   Patella mobility  decreased mobility through Rt patella with laterally tracking patella     Palpation comment  muscular tightness through Rt quad and banding through the lateral distal quad       Ambulation/Gait   Gait Comments  ambulates with limp  Rt LE - holding Rt LE stiffly with decreased swing through Rt; push off with ankle PF Lt              Objective measurements completed on examination: See above findings.      [facility] Adult PT Treatment/Exercise - 08/28/17 0001      Knee/Hip Exercises: Stretches   Passive Hamstring Stretch  Right;Left;2 reps;30 seconds supine with strap     Quad Stretch  Right;Left;2 reps;30 seconds prone with strap     Hip Flexor Stretch  Right;1 rep;30 seconds supine dropping Rt LE off edge of table     ITB Stretch  Right;2 reps;30 seconds supine with strap     Piriformis Stretch  Right;1 rep;30 seconds supine travell     Gastroc Stretch  Left;Right;2 reps;30 seconds standing at counter       Cryotherapy   Number Minutes Cryotherapy  12 Minutes    Cryotherapy Location  Knee Rt    Type of Cryotherapy  Ice pack      Manual Therapy   Kinesiotex  -- Rt patella for imporved alignment       Kinesiotix   Inhibit Muscle   lateral quad    Facilitate Muscle   medial quad              PT Education - 08/28/17 1734    Education provided  Yes    Education Details  HEP     Person(s) Educated  Patient    Methods  Explanation;Demonstration;Tactile cues;Verbal cues;Handout    Comprehension  Verbalized understanding;Returned demonstration;Verbal cues required;Tactile cues required          PT Long Term Goals - 08/28/17 1753      PT LONG TERM GOAL #1  Title  Improve quad extensibility to 125 deg knee flexion in prone for bilat LE's 10/11/17     Time  6    Period  Weeks    Status  New      PT LONG TERM GOAL #2   Title  Decrease pain with functional activities including sitting, standing; walking ; ascending and descending stairs to 0/10 to 2/10 at most 10/11/17    Time  6    Period  Weeks    Status  New      PT LONG TERM GOAL #3   Title  Patient reports return to regular exercise including zumba class(for 20-30 min class) 10/11/17    Time  6    Period  Weeks    Status  New      PT  LONG TERM GOAL #4   Title  Independent in HEP 10/11/17    Time  6    Period  Weeks    Status  New      PT LONG TERM GOAL #5   Title  Improve FOTO to </=41% limitation 10/11/17    Time  6    Period  Weeks    Status  New             Plan - 08/28/17 1747    Clinical Impression Statement  Natalie Daniels presents with signs and symptoms consistent with Rt patellofemoral syndrome. She has pain with functional activities; muscular tightness and shortening through LE's; poor patellar tracking; pain with palpation Rt patella with banding lateral quad. Patient wil benefit from PT to address problems identified.     History and Personal Factors relevant to plan of care:  reinjury 11/18 following initial injury 8/17; lumbar fusion 13 yr ago     Clinical Presentation  Evolving    Clinical Decision Making  Moderate    Rehab Potential  Good    PT Frequency  2x / week    PT Duration  6 weeks    PT Treatment/Interventions  Patient/family education;ADLs/Self Care Home Management;Cryotherapy;Electrical Stimulation;Iontophoresis 4mg /ml Dexamethasone;Moist Heat;Ultrasound;Dry needling;Manual techniques;Neuromuscular re-education;Therapeutic activities;Therapeutic exercise;Taping    PT Next Visit Plan  review HEP; taping; myofacial release and manual work for [employer] - instruction in Patent examinerself massage/stick for quads; trial of estim/US/ionto/ice/heat as indicated    Consulted and Agree with Plan of Care  Patient       Patient will benefit from skilled therapeutic intervention in order to improve the following deficits and impairments:  Improper body mechanics, Impaired UE functional use, Increased muscle spasms, Increased fascial restricitons, Decreased mobility, Decreased range of motion, Abnormal gait, Decreased activity tolerance, Pain  Visit Diagnosis: Acute pain of right knee - Plan: PT plan of care cert/re-cert  Other symptoms and signs involving the musculoskeletal system - Plan: PT plan of care  cert/re-cert  Other abnormalities of gait and mobility - Plan: PT plan of care cert/re-cert     Problem List Patient Active Problem List   Diagnosis Date Noted  . Patellofemoral pain syndrome 07/29/2017  . Chronic pain of right knee 07/03/2017  . Former smoker, stopped smoking many years ago 07/03/2017  . Allergic rhinitis 11/17/2015  . History of motion sickness 11/17/2015    Charlesa Ehle Rober MinionP Athan Casalino PT, MPH  08/28/2017, 6:06 PM  [facility] [address] [place] [address] [place], [place], [address] Phone: [phone]   Fax:  [phone]  Name: Natalie Daniels MRN: [id number] Date of Birth: 1971-04-06

## 2017-09-11 ENCOUNTER — Ambulatory Visit: Payer: 59 | Admitting: Rehabilitative and Restorative Service Providers"

## 2017-09-11 ENCOUNTER — Encounter: Payer: Self-pay | Admitting: Rehabilitative and Restorative Service Providers"

## 2017-09-11 DIAGNOSIS — R29898 Other symptoms and signs involving the musculoskeletal system: Secondary | ICD-10-CM

## 2017-09-11 DIAGNOSIS — M25561 Pain in right knee: Secondary | ICD-10-CM | POA: Diagnosis not present

## 2017-09-11 DIAGNOSIS — R2689 Other abnormalities of gait and mobility: Secondary | ICD-10-CM

## 2017-09-11 NOTE — Therapy (Signed)
[facility] [place] [address] [place], [place], [address] Phone: [phone]   Fax:  [phone]  Physical Therapy Treatment  Patient Details  Name: Natalie Daniels MRN: [id number] Date of Birth: 1971/06/12 Referring Provider: Dr Denyse Amassorey    Encounter Date: 09/11/2017  PT End of Session - 09/11/17 1708    Visit Number  2    Number of Visits  12    Date for PT Re-Evaluation  10/11/17    PT Start Time  1604    PT Stop Time  1656    PT Time Calculation (min)  52 min    Activity Tolerance  Patient tolerated treatment well       Past Medical History:  Diagnosis Date  . Constipation   . Plantar fasciitis   . Rosacea     Past Surgical History:  Procedure Laterality Date  . ANTERIOR FUSION LUMBAR SPINE    . BACK SURGERY    . CESAREAN SECTION    . COLONOSCOPY W/ POLYPECTOMY     age 47    There were no vitals filed for this visit.  Subjective Assessment - 09/11/17 1709    Subjective  Patient reports that she was sensitive to the tape and had to take the tape off in a couple of days. Patient has been unable to do the quad stretch due to pressure on the knee when she is lying on her stomach. She has incresaed LBP th the quad stretch in supine.     Currently in Pain?  Yes    Pain Score  3     Pain Location  Knee    Pain Orientation  Right    Pain Descriptors / Indicators  Aching;Lambert ModySharp         [facility] - 09/11/17 0001      Assessment   Medical Diagnosis  Rt patellofemoral knee pain     Referring Provider  Dr Denyse Amassorey     Onset Date/Surgical Date  05/10/17    Hand Dominance  Right    Next MD Visit  PRN     Prior Therapy  none for knee       Flexibility   Hamstrings  tight bilat ~ 75 deg     Quadriceps  tight Rt 120 deg; Lt 111 deg    ITB  tight Rt > Lt     Piriformis  tight Rt > Lt       Palpation   Palpation comment  muscular tightness through Rt quad and banding through the lateral distal quad              No data recorded       [facility] Adult PT Treatment/Exercise - 09/11/17 0001      Knee/Hip Exercises: Stretches   Passive Hamstring Stretch  Right;Left;2 reps;30 seconds supine with strap     Quad Stretch  Right;Left;2 reps;30 seconds prone with strap     Hip Flexor Stretch  Right;1 rep;30 seconds supine dropping Rt LE off edge of table     ITB Stretch  Right;2 reps;30 seconds supine with strap     Piriformis Stretch  Right;1 rep;30 seconds supine travell     Gastroc Stretch  Left;Right;2 reps;30 seconds standing at counter       Knee/Hip Exercises: Aerobic   Nustep  L5 x 5 min       Knee/Hip Exercises: Supine   Straight Leg Raise with External Rotation  Strengthening;Right;5 reps  Cryotherapy   Number Minutes Cryotherapy  14 Minutes    Cryotherapy Location  Knee Rt    Type of Cryotherapy  Ice pack      Electrical Stimulation   Electrical Stimulation Location  Rt knee     Electrical Stimulation Action  IFC    Electrical Stimulation Parameters  to tolerance    Electrical Stimulation Goals  Pain;Tone      Manual Therapy   Manual therapy comments  patient instructed in myofacial release with stick and ball Rt quad/hamstring     Kinesiotex  -- test patch of sensitive skin tape              PT Education - 09/11/17 1751    Education provided  Yes    Education Details  HEP     Person(s) Educated  Patient    Methods  Explanation;Demonstration;Tactile cues;Verbal cues;Handout    Comprehension  Verbalized understanding;Returned demonstration;Verbal cues required;Tactile cues required          PT Long Term Goals - 09/11/17 1708      PT LONG TERM GOAL #1   Title  Improve quad extensibility to 125 deg knee flexion in prone for bilat LE's 10/11/17     Time  6    Period  Weeks    Status  On-going      PT LONG TERM GOAL #2   Title  Decrease pain with functional activities including sitting, standing; walking ; ascending and descending stairs to 0/10 to  2/10 at most 10/11/17    Time  6    Period  Weeks    Status  On-going      PT LONG TERM GOAL #3   Title  Patient reports return to regular exercise including zumba class(for 20-30 min class) 10/11/17    Time  6    Period  Weeks    Status  On-going      PT LONG TERM GOAL #4   Title  Independent in HEP 10/11/17    Time  6    Period  Weeks    Status  On-going      PT LONG TERM GOAL #5   Title  Improve FOTO to </=41% limitation 10/11/17    Time  6    Period  Weeks    Status  On-going            Plan - 09/11/17 1752    Clinical Impression Statement  Natalie Daniels reports some improvement in knee pain but continued pain limiting function. She has questions about the exercises. Exercises were corrected and patient demonstrated correct technique. Added manual therapy/deep tissue work through the [employer]. Discussed possibility of dry needing Rt quad.     Rehab Potential  Good    PT Frequency  2x / week    PT Duration  6 weeks    PT Treatment/Interventions  Patient/family education;ADLs/Self Care Home Management;Cryotherapy;Electrical Stimulation;Iontophoresis 4mg /ml Dexamethasone;Moist Heat;Ultrasound;Dry needling;Manual techniques;Neuromuscular re-education;Therapeutic activities;Therapeutic exercise;Taping    PT Next Visit Plan  review HEP; taping as skin tolerance allows; myofacial release and manual work for [employer]; assess response to estim/US/ionto/ice/heat as indicated    Consulted and Agree with Plan of Care  Patient       Patient will benefit from skilled therapeutic intervention in order to improve the following deficits and impairments:  Improper body mechanics, Impaired UE functional use, Increased muscle spasms, Increased fascial restricitons, Decreased mobility, Decreased range of motion, Abnormal gait, Decreased activity tolerance, Pain  Visit Diagnosis:  Acute pain of right knee  Other symptoms and signs involving the musculoskeletal system  Other abnormalities of gait and  mobility     Problem List Patient Active Problem List   Diagnosis Date Noted  . Patellofemoral pain syndrome 07/29/2017  . Chronic pain of right knee 07/03/2017  . Former smoker, stopped smoking many years ago 07/03/2017  . Allergic rhinitis 11/17/2015  . History of motion sickness 11/17/2015    Natalie Daniels Rober Minion PT, MPH  09/11/2017, 5:57 PM  [facility] [address] [place], [place], [address] Phone: [phone]   Fax:  [phone]  Name: Natalie Daniels MRN: [id number] Date of Birth: 26-Nov-1970

## 2017-09-11 NOTE — Patient Instructions (Addendum)
The stick - from [employer]       Straight Leg Raise: With External Leg Rotation    Lie on back with right leg straight, opposite leg bent. Rotate straight leg out and lift _8-10_ inches. Repeat __5__ times per set. Do ____ sets per session. Do __1-2__ sessions per day.       Trigger Point Dry Needling   . What is Trigger Point Dry Needling (DN)? o DN is a physical therapy technique used to treat muscle pain and dysfunction. Specifically, DN helps deactivate muscle trigger points (muscle knots).  o A thin filiform needle is used to penetrate the skin and stimulate the underlying trigger point. The goal is for a local twitch response (LTR) to occur and for the trigger point to relax. No medication of any kind is injected during the procedure.   . What Does Trigger Point Dry Needling Feel Like?  o The procedure feels different for each individual patient. Some patients report that they do not actually feel the needle enter the skin and overall the process is not painful. Very mild bleeding may occur. However, many patients feel a deep cramping in the muscle in which the needle was inserted. This is the local twitch response.   Marland Kitchen. How Will I feel after the treatment? o Soreness is normal, and the onset of soreness may not occur for a few hours. Typically this soreness does not last longer than two days.  o Bruising is uncommon, however; ice can be used to decrease any possible bruising.  o In rare cases feeling tired or nauseous after the treatment is normal. In addition, your symptoms may get worse before they get better, this period will typically not last longer than 24 hours.   . What Can I do After My Treatment? o Increase your hydration by drinking more water for the next 24 hours. o You may place ice or heat on the areas treated that have become sore, however, do not use heat on inflamed or bruised areas. Heat often brings more relief post needling. o You can continue your regular  activities, but vigorous activity is not recommended initially after the treatment for 24 hours. o DN is best combined with other physical therapy such as strengthening, stretching, and other therapies.

## 2017-09-15 ENCOUNTER — Encounter: Payer: Self-pay | Admitting: Rehabilitative and Restorative Service Providers"

## 2017-09-15 ENCOUNTER — Ambulatory Visit: Payer: 59 | Admitting: Rehabilitative and Restorative Service Providers"

## 2017-09-15 DIAGNOSIS — M25561 Pain in right knee: Secondary | ICD-10-CM

## 2017-09-15 DIAGNOSIS — R29898 Other symptoms and signs involving the musculoskeletal system: Secondary | ICD-10-CM

## 2017-09-15 DIAGNOSIS — R2689 Other abnormalities of gait and mobility: Secondary | ICD-10-CM | POA: Diagnosis not present

## 2017-09-15 NOTE — Therapy (Signed)
[facility] [place] [address] [place], [place], [address] Phone: [phone]   Fax:  [phone]  Physical Therapy Treatment  Patient Details  Name: Natalie Daniels MRN: [id number] Date of Birth: [birth year] Referring Provider: Dr Denyse Amass    Encounter Date: 09/15/2017  PT End of Session - 09/15/17 1611    Visit Number  3    Number of Visits  12    Date for PT Re-Evaluation  10/11/17    PT Start Time  1603    PT Stop Time  1657    PT Time Calculation (min)  54 min    Activity Tolerance  Patient tolerated treatment well       Past Medical History:  Diagnosis Date  . Constipation   . Plantar fasciitis   . Rosacea     Past Surgical History:  Procedure Laterality Date  . ANTERIOR FUSION LUMBAR SPINE    . BACK SURGERY    . CESAREAN SECTION    . COLONOSCOPY W/ POLYPECTOMY     age 65    There were no vitals filed for this visit.  Subjective Assessment - 09/15/17 1612    Subjective  Patient reports that she has been doing the exercises. Did well with the sensitive tape and wants to try it with tape. Will still feel a jolt in the knee when she is walking or sometimes when she is driving.     Currently in Pain?  Yes    Pain Score  3     Pain Location  Knee    Pain Orientation  Right    Pain Descriptors / Indicators  Aching;Sharp    Pain Type  Acute pain;Chronic pain         [facility] PT Assessment - 09/15/17 0001      Assessment   Medical Diagnosis  Rt patellofemoral knee pain     Referring Provider  Dr Denyse Amass     Onset Date/Surgical Date  05/10/17    Hand Dominance  Right    Next MD Visit  PRN     Prior Therapy  none for knee       Flexibility   Hamstrings  tight bilat ~ 75 deg     Quadriceps  tight Rt 115 deg; Lt 120 deg    ITB  tight Rt > Lt     Piriformis  tight Rt > Lt       Palpation   Palpation comment  muscular tightness through Rt quad and banding through the lateral distal quad                     [facility] Adult PT Treatment/Exercise - 09/15/17 0001      Knee/Hip Exercises: Stretches   Passive Hamstring Stretch  Right;2 reps;30 seconds supine with strap     Quad Stretch  Right;2 reps;30 seconds prone with strap     Hip Flexor Stretch  Right;1 rep;30 seconds supine dropping Rt LE off edge of table     ITB Stretch  Right;2 reps;30 seconds supine with strap       Knee/Hip Exercises: Standing   Hip ADduction  Strengthening;Both;5 reps adductor squeeze with ball - some pain stopped at 5 reps     Hip ADduction Limitations  trial of ball squeeze in supine for home     Forward Step Up  Right;10 reps;Step Height: 4";Hand Hold: 2    Functional Squat  10 reps with ball slide keeping knees behind  toes     SLS  Rt/Lt 20 sec x 3 reps each LE w/ UE support as needed       Knee/Hip Exercises: Supine   Straight Leg Raise with External Rotation  Strengthening;Right;5 reps;2 sets      Electrical Stimulation   Electrical Stimulation Location  Rt knee     Electrical Stimulation Action  IFC    Electrical Stimulation Parameters  to tolerance    Electrical Stimulation Goals  Pain;Tone      Vasopneumatic   Number Minutes Vasopneumatic   15 minutes    Vasopnuematic Location   Knee Rt    Vasopneumatic Pressure  Low    Vasopneumatic Temperature   34 deg      Manual Therapy   Kinesiotex  Inhibit Muscle;Facilitate Muscle      Kinesiotix   Inhibit Muscle   lateral quad    Facilitate Muscle   medial quad              PT Education - 09/15/17 1650    Education provided  Yes    Education Details  HEP     Person(s) Educated  Patient    Methods  Explanation;Demonstration;Tactile cues;Verbal cues;Handout    Comprehension  Verbalized understanding;Returned demonstration;Verbal cues required;Tactile cues required          PT Long Term Goals - 09/15/17 1612      PT LONG TERM GOAL #1   Title  Improve quad extensibility to 125 deg knee flexion in prone for bilat LE's  10/11/17     Time  6    Period  Weeks    Status  On-going      PT LONG TERM GOAL #2   Title  Decrease pain with functional activities including sitting, standing; walking ; ascending and descending stairs to 0/10 to 2/10 at most 10/11/17    Time  6    Period  Weeks    Status  On-going      PT LONG TERM GOAL #3   Title  Patient reports return to regular exercise including zumba class(for 20-30 min class) 10/11/17    Time  6    Period  Weeks    Status  On-going      PT LONG TERM GOAL #4   Title  Independent in HEP 10/11/17    Time  6    Period  Weeks    Status  On-going      PT LONG TERM GOAL #5   Title  Improve FOTO to </=41% limitation 10/11/17    Time  6    Period  Weeks    Status  On-going            Plan - 09/15/17 1716    Clinical Impression Statement  Persistent pain in Rt > Lt knee. Patient reports some improvement and she is doing more with her exercises at home. She is still having pain with exercises and difficulty with hip flexor stretch on her bed. She will try this stretch on the dining room chair. She is not willing to try DN at this wime but would like to try the taping with the sensitive tape.     Rehab Potential  Good    PT Frequency  2x / week    PT Duration  6 weeks    PT Treatment/Interventions  Patient/family education;ADLs/Self Care Home Management;Cryotherapy;Electrical Stimulation;Iontophoresis 4mg /ml Dexamethasone;Moist Heat;Ultrasound;Dry needling;Manual techniques;Neuromuscular re-education;Therapeutic activities;Therapeutic exercise;Taping    PT Next Visit Plan  review HEP;  taping as skin tolerance allows; myofacial release and manual work for [employer]; continue estim/vaso//US/ionto/ice/heat as indicated    Financial plannerConsulted and Agree with Plan of Care  Patient       Patient will benefit from skilled therapeutic intervention in order to improve the following deficits and impairments:  Improper body mechanics, Impaired UE functional use, Increased muscle  spasms, Increased fascial restricitons, Decreased mobility, Decreased range of motion, Abnormal gait, Decreased activity tolerance, Pain  Visit Diagnosis: Acute pain of right knee  Other symptoms and signs involving the musculoskeletal system  Other abnormalities of gait and mobility     Problem List Patient Active Problem List   Diagnosis Date Noted  . Patellofemoral pain syndrome 07/29/2017  . Chronic pain of right knee 07/03/2017  . Former smoker, stopped smoking many years ago 07/03/2017  . Allergic rhinitis 11/17/2015  . History of motion sickness 11/17/2015    Celyn Rober MinionP Holt PT, MPH  09/15/2017, 5:21 PM  [facility] [address] [place] [address] [place], [place], [address] Phone: [phone]   Fax:  [phone]  Name: Natalie MassedMelissa Daniels MRN: [id number] Date of Birth: [birth year]

## 2017-09-15 NOTE — Patient Instructions (Addendum)
Functional Quadriceps: therapy ball at back    Don't let knees come forward over toes  Keeping feet flat on floor, shoulder width apart, squat as low as is comfortable. Use support as necessary. Repeat __5-10__ times per set. Do _1-2___ sets per session. Do __1__ sessions per day.   Strengthening: Hip Adduction - Isometric    With ball or folded pillow between knees, squeeze knees together. Hold __5__ seconds. Repeat _5-10___ times per set. Do ___1-3_ sets per session. Do __1__ sessions per day.   Balance: Unilateral    Attempt to balance on left leg, eyes open. Hold __20-30__ seconds. Repeat __3-5 __ times per set. Do __1-2__ sets per session. Do __1__ sessions per day. Perform exercise with eyes closed.   Anterior Step-Up    Stand with __4_ inch step placed in A direction. Step onto step with right foot without pushing off with the ground foot.  Step back down with the left lowering yourself with the right  _5-10 __ times. __1-2_ sets __1_ times per day.

## 2017-09-18 ENCOUNTER — Ambulatory Visit: Payer: 59 | Admitting: Rehabilitative and Restorative Service Providers"

## 2017-09-18 ENCOUNTER — Encounter: Payer: Self-pay | Admitting: Rehabilitative and Restorative Service Providers"

## 2017-09-18 DIAGNOSIS — R2689 Other abnormalities of gait and mobility: Secondary | ICD-10-CM | POA: Diagnosis not present

## 2017-09-18 DIAGNOSIS — R29898 Other symptoms and signs involving the musculoskeletal system: Secondary | ICD-10-CM

## 2017-09-18 DIAGNOSIS — M25561 Pain in right knee: Secondary | ICD-10-CM

## 2017-09-18 NOTE — Therapy (Signed)
Natalie Daniels, Natalie Daniels, Natalie Daniels Phone: [phone]   Fax:  [phone]  Physical Therapy Treatment  Patient Details  Name: Natalie Daniels MRN: [id number] Date of Birth: Mar 24, 1971 Referring Provider: Dr Denyse Amass    Encounter Date: 09/18/2017  PT End of Session - 09/18/17 1710    Visit Number  4    Number of Visits  12    Date for PT Re-Evaluation  10/11/17    PT Start Time  1501    PT Stop Time  1557    PT Time Calculation (min)  56 min    Activity Tolerance  Patient tolerated treatment well       Past Medical History:  Diagnosis Date  . Constipation   . Plantar fasciitis   . Rosacea     Past Surgical History:  Procedure Laterality Date  . ANTERIOR FUSION LUMBAR SPINE    . BACK SURGERY    . CESAREAN SECTION    . COLONOSCOPY W/ POLYPECTOMY     age 25    There were no vitals filed for this visit.  Subjective Assessment - 09/18/17 1710    Subjective  Patient reports that the tape stayed on a day or less. Feels the tape hurts more when on than not on. TENS unit is helping with the pain. She is using it several times per day. She continues to have pain - which is increased after therapy for several hours. Ice and estim help with ice.     Currently in Pain?  Yes    Pain Score  3  5/10 following therapy     Pain Orientation  Right    Pain Descriptors / Indicators  Aching;Sharp    Pain Type  Acute pain    Pain Onset  More than a month ago    Pain Frequency  Intermittent                       [facility] Adult PT Treatment/Exercise - 09/18/17 0001      Knee/Hip Exercises: Stretches   Passive Hamstring Stretch  Right;2 reps;30 seconds supine with strap     Quad Stretch  Right;2 reps;30 seconds prone with strap     Hip Flexor Stretch  Right;30 seconds;3 reps seated at edge of table       Knee/Hip Exercises: Aerobic   Nustep  L5 x 5 min       Knee/Hip Exercises: Standing   Forward  Step Up  Right;10 reps;Step Height: 4";Hand Hold: 2;2 sets one set Lt     SLS  Rt/Lt 20 sec x 3 reps each LE w/ UE support as needed     SLS with Vectors  Rt SLS reaching forward toward back of truck(unable to reach to treatment table height)       Knee/Hip Exercises: Seated   Sit to Sand  2 sets;5 reps;without UE support focus on eccentric lowering from ~ 2.5 ft       Knee/Hip Exercises: Supine   Straight Leg Raise with External Rotation  Strengthening;Right;5 reps;2 sets      Electrical Stimulation   Electrical Stimulation Location  Rt knee     Electrical Stimulation Action  IFC    Electrical Stimulation Parameters  to tolerance    Electrical Stimulation Goals  Pain;Tone      Vasopneumatic   Number Minutes Vasopneumatic   15 minutes    Vasopnuematic Location   Knee Rt  Vasopneumatic Pressure  Low    Vasopneumatic Temperature   34 deg             PT Education - 09/18/17 1746    Education provided  Yes    Education Details  HEP     Person(s) Educated  Patient    Methods  Explanation;Demonstration;Tactile cues;Verbal cues;Handout    Comprehension  Verbalized understanding;Returned demonstration;Verbal cues required;Tactile cues required          PT Long Term Goals - 09/15/17 1612      PT LONG TERM GOAL #1   Title  Improve quad extensibility to 125 deg knee flexion in prone for bilat LE's 10/11/17     Time  6    Period  Weeks    Status  On-going      PT LONG TERM GOAL #2   Title  Decrease pain with functional activities including sitting, standing; walking ; ascending and descending stairs to 0/10 to 2/10 at most 10/11/17    Time  6    Period  Weeks    Status  On-going      PT LONG TERM GOAL #3   Title  Patient reports return to regular exercise including zumba class(for 20-30 min class) 10/11/17    Time  6    Period  Weeks    Status  On-going      PT LONG TERM GOAL #4   Title  Independent in HEP 10/11/17    Time  6    Period  Weeks    Status  On-going       PT LONG TERM GOAL #5   Title  Improve FOTO to </=41% limitation 10/11/17    Time  6    Period  Weeks    Status  On-going            Plan - 09/18/17 1726    Clinical Impression Statement  Continued pain Rt > Lt knee. Some increased tolerance with exercises.     Rehab Potential  Good    PT Frequency  2x / week    PT Duration  6 weeks    PT Treatment/Interventions  Patient/family education;ADLs/Self Care Home Management;Cryotherapy;Electrical Stimulation;Iontophoresis 4mg /ml Dexamethasone;Moist Heat;Ultrasound;Dry needling;Manual techniques;Neuromuscular re-education;Therapeutic activities;Therapeutic exercise;Taping    PT Next Visit Plan  review HEP; taping as skin tolerance allows; myofacial release and manual work for [employer]; continue estim/vaso//US/ionto/ice/heat as indicated    Financial plannerConsulted and Agree with Plan of Care  Patient       Patient will benefit from skilled therapeutic intervention in order to improve the following deficits and impairments:  Improper body mechanics, Impaired UE functional use, Increased muscle spasms, Increased fascial restricitons, Decreased mobility, Decreased range of motion, Abnormal gait, Decreased activity tolerance, Pain  Visit Diagnosis: Acute pain of right knee  Other symptoms and signs involving the musculoskeletal system  Other abnormalities of gait and mobility     Problem List Patient Active Problem List   Diagnosis Date Noted  . Patellofemoral pain syndrome 07/29/2017  . Chronic pain of right knee 07/03/2017  . Former smoker, stopped smoking many years ago 07/03/2017  . Allergic rhinitis 11/17/2015  . History of motion sickness 11/17/2015    Natalie Daniels PT,MPH  09/18/2017, 5:47 PM  [facility] [address] [place], [place], [address] Phone: [phone]   Fax:  [phone]  Name: Natalie Daniels MRN: [id number] Date of Birth: 1970/07/29

## 2017-09-18 NOTE — Patient Instructions (Addendum)
    SIT TO STAND: No Device    Sit with feet shoulder-width apart, on floor. Lean chest forward, raise hips up from surface. Straighten hips and knees. Weight bear equally on left and right sides. __5_ reps per set, _1-2__ sets per day, __1 time/day  Balance: Unilateral - Forward Lean    Stand on left foot, hands on hips. Keeping hips level, bend forward as if to touch forehead to wall. Hold __2-3__ seconds. Relax. Repeat _5-10__ times per set. Do __1-2__ sets per session. Do _1-2_ sessions per day.

## 2017-09-22 ENCOUNTER — Ambulatory Visit: Payer: 59 | Admitting: Rehabilitative and Restorative Service Providers"

## 2017-09-22 ENCOUNTER — Encounter: Payer: Self-pay | Admitting: Rehabilitative and Restorative Service Providers"

## 2017-09-22 DIAGNOSIS — R2689 Other abnormalities of gait and mobility: Secondary | ICD-10-CM | POA: Diagnosis not present

## 2017-09-22 DIAGNOSIS — R29898 Other symptoms and signs involving the musculoskeletal system: Secondary | ICD-10-CM

## 2017-09-22 DIAGNOSIS — M25561 Pain in right knee: Secondary | ICD-10-CM

## 2017-09-22 NOTE — Patient Instructions (Signed)
Massage to the outside of the knee cap area (right side of knee)  1 minute deep pressure twice a day   Step-Down: Forward    Step down forward with leg foot. Keep pelvis neutral. Do _10__ times work to 2-3 sets, each leg, _1-2__ times per day.   walking side steps along hallway  10-15 ft x 3-5 each direction   Ix/day

## 2017-09-22 NOTE — Therapy (Addendum)
[facility] [place] [address] [place], [place], [address] Phone: [phone]   Fax:  [phone]  Physical Therapy Treatment  Patient Details  Name: Natalie Daniels MRN: [id number] Date of Birth: 1971/04/03 Referring Provider: Dr Denyse Amass    Encounter Date: 09/22/2017  PT End of Session - 09/22/17 1557    Visit Number  5    Number of Visits  12    Date for PT Re-Evaluation  10/11/17    PT Start Time  1557    PT Stop Time  1654    PT Time Calculation (min)  57 min    Activity Tolerance  Patient tolerated treatment well       Past Medical History:  Diagnosis Date  . Constipation   . Plantar fasciitis   . Rosacea     Past Surgical History:  Procedure Laterality Date  . ANTERIOR FUSION LUMBAR SPINE    . BACK SURGERY    . CESAREAN SECTION    . COLONOSCOPY W/ POLYPECTOMY     age 47    There were no vitals filed for this visit.  Subjective Assessment - 09/22/17 1558    Subjective  Patient reports that she is having a little less pain today. Still doing her exercises and using the TENs unit and the voltaren gel. "It is getting better so I'm try in gto be positive."     Currently in Pain?  Yes    Pain Score  2     Pain Location  Knee    Pain Orientation  Right    Pain Descriptors / Indicators  Aching;Sore    Pain Type  Acute pain    Pain Onset  More than a month ago    Pain Frequency  Intermittent         [facility] PT Assessment - 09/22/17 0001      Assessment   Medical Diagnosis  Rt patellofemoral knee pain       Flexibility   Quadriceps  Rt 121 degrees       Palpation   Palpation comment  muscular tightness through Rt quad and banding through the lateral distal quad                    [facility] Adult PT Treatment/Exercise - 09/22/17 0001      Knee/Hip Exercises: Stretches   Passive Hamstring Stretch  Right;2 reps;30 seconds supine with strap     Quad Stretch  Right;2 reps;30 seconds prone with strap      Hip Flexor Stretch  Right;30 seconds;3 reps seated at edge of table     ITB Stretch  Right;2 reps;30 seconds      Knee/Hip Exercises: Aerobic   Nustep  L5 x 5 min       Knee/Hip Exercises: Standing   Hip ADduction  Strengthening;Right;Left;2 sets;10 reps with sm orange ball with wall slide 5 sec pause     Forward Step Up  Right;10 reps;Hand Hold: 2;2 sets;Step Height: 6" one set Lt     Step Down  Left;2 sets;10 reps;Hand Hold: 2;Step Height: 4"    Wall Squat  2 sets;10 reps with large orange ball hold 5 sec keeping knees behind toes    SLS  Rt/Lt 20 sec x 3 reps each LE w/ UE support as needed     SLS with Vectors  Rt SLS reaching forward toward ~3 ft surface 2 sets of 10     Other Standing Knee Exercises  side steps x 20 feet x 2 times each direction       Knee/Hip Exercises: Seated   Sit to Sand  5 reps;without UE support;3 sets focus on eccentric lowering from ~ 2.5 ft       Electrical Stimulation   Electrical Stimulation Location  Rt knee     Electrical Stimulation Action  IFC    Electrical Stimulation Parameters  to tolerance    Electrical Stimulation Goals  Pain;Tone      Vasopneumatic   Number Minutes Vasopneumatic   15 minutes    Vasopnuematic Location   Knee Rt    Vasopneumatic Pressure  Low    Vasopneumatic Temperature   34 deg             PT Education - 09/22/17 1641    Education provided  Yes    Education Details  HEP     Person(s) Educated  Patient    Methods  Explanation;Demonstration;Tactile cues;Verbal cues;Handout    Comprehension  Verbalized understanding;Returned demonstration;Verbal cues required;Tactile cues required          PT Long Term Goals - 09/22/17 1557      PT LONG TERM GOAL #1   Title  Improve quad extensibility to 125 deg knee flexion in prone for bilat LE's 10/11/17     Time  6    Period  Weeks    Status  On-going      PT LONG TERM GOAL #2   Title  Decrease pain with functional activities including sitting, standing; walking  ; ascending and descending stairs to 0/10 to 2/10 at most 10/11/17    Time  6    Period  Weeks    Status  On-going      PT LONG TERM GOAL #3   Title  Patient reports return to regular exercise including zumba class(for 20-30 min class) 10/11/17    Time  6    Period  Weeks    Status  On-going      PT LONG TERM GOAL #4   Title  Independent in HEP 10/11/17    Time  6    Period  Weeks    Status  On-going      PT LONG TERM GOAL #5   Title  Improve FOTO to </=41% limitation 10/11/17    Time  6    Period  Weeks    Status  On-going            Plan - 09/22/17 1602    Clinical Impression Statement  Some improvement in pain. Patient is working on exercises and pain management at home and sees progress. Gait pattern is improving; ROM Rt knee is increasing gradually; patient is tolerating incresaed exercise. She is gradually progressing toward stated goals of therapy.     Rehab Potential  Good    PT Frequency  2x / week    PT Duration  6 weeks    PT Treatment/Interventions  Patient/family education;ADLs/Self Care Home Management;Cryotherapy;Electrical Stimulation;Iontophoresis 4mg /ml Dexamethasone;Moist Heat;Ultrasound;Dry needling;Manual techniques;Neuromuscular re-education;Therapeutic activities;Therapeutic exercise;Taping    PT Next Visit Plan  review HEP; taping as skin tolerance allows; myofacial release and manual work for [employer]; continue estim/vaso//US/ionto/ice/heat as indicated    Financial planner with Plan of Care  Patient       Patient will benefit from skilled therapeutic intervention in order to improve the following deficits and impairments:  Improper body mechanics, Impaired UE functional use, Increased muscle spasms, Increased fascial restricitons, Decreased mobility, Decreased  range of motion, Abnormal gait, Decreased activity tolerance, Pain  Visit Diagnosis: Acute pain of right knee  Other symptoms and signs involving the musculoskeletal system  Other  abnormalities of gait and mobility     Problem List Patient Active Problem List   Diagnosis Date Noted  . Patellofemoral pain syndrome 07/29/2017  . Chronic pain of right knee 07/03/2017  . Former smoker, stopped smoking many years ago 07/03/2017  . Allergic rhinitis 11/17/2015  . History of motion sickness 11/17/2015    Celyn Rober MinionP Holt PT, MPH  09/22/2017, 4:43 PM  [facility] [address] [place], [place], [address] Phone: [phone]   Fax:  [phone]  Name: Natalie Daniels MRN: [id number] Date of Birth: 07/10/1970

## 2017-09-25 ENCOUNTER — Ambulatory Visit (INDEPENDENT_AMBULATORY_CARE_PROVIDER_SITE_OTHER): Payer: 59 | Admitting: Rehabilitative and Restorative Service Providers"

## 2017-09-25 DIAGNOSIS — M25561 Pain in right knee: Secondary | ICD-10-CM | POA: Diagnosis not present

## 2017-09-25 DIAGNOSIS — R29898 Other symptoms and signs involving the musculoskeletal system: Secondary | ICD-10-CM

## 2017-09-25 DIAGNOSIS — R2689 Other abnormalities of gait and mobility: Secondary | ICD-10-CM | POA: Diagnosis not present

## 2017-09-25 NOTE — Patient Instructions (Signed)
Lateral Step down     Stand to side of step. Step up leading with right leg. Slowly step down leading with right heel to tap heel down on floor, then straighten right leg again Move slowly without shifting weight. Perform _2 sets of 10 __ reps.

## 2017-09-25 NOTE — Therapy (Signed)
[facility] [address] [place], [place], [address] Phone: [phone]   Fax:  [phone]  Physical Therapy Treatment  Patient Details  Name: Natalie Daniels MRN: [id number] Date of Birth: Apr 11, 1971 Referring Provider: Dr Denyse Amass    Encounter Date: 09/25/2017  PT End of Session - 09/25/17 1705    Visit Number  6    Number of Visits  12    Date for PT Re-Evaluation  10/11/17    PT Start Time  1659    PT Stop Time  1755    PT Time Calculation (min)  56 min    Activity Tolerance  Patient tolerated treatment well       Past Medical History:  Diagnosis Date  . Constipation   . Plantar fasciitis   . Rosacea     Past Surgical History:  Procedure Laterality Date  . ANTERIOR FUSION LUMBAR SPINE    . BACK SURGERY    . CESAREAN SECTION    . COLONOSCOPY W/ POLYPECTOMY     age 10    There were no vitals filed for this visit.  Subjective Assessment - 09/25/17 1710    Subjective  Knee is feeling better! Can really tell a difference in how the knee feels. It's just a 1 today!     Currently in Pain?  Yes    Pain Score  1     Pain Location  Knee    Pain Orientation  Right    Pain Descriptors / Indicators  Aching;Sore                       [facility] Adult PT Treatment/Exercise - 09/25/17 0001      Knee/Hip Exercises: Stretches   Higher education careers adviser reps;30 seconds prone with strap       Knee/Hip Exercises: Aerobic   Recumbent Bike  L3 x 6 min       Knee/Hip Exercises: Standing   Lateral Step Up  Right;Left;10 reps;2 sets;Hand Hold: 1;Hand Hold: 0;Step Height: 6" heel touch only     Forward Step Up  Right;10 reps;Hand Hold: 2;2 sets;Step Height: 6" one set Lt     Step Down  Left;2 sets;10 reps;Hand Hold: 2;Step Height: 4"    SLS  Rt/Lt 30 sec x 3 reps each LE w/ UE support as needed     SLS with Vectors  Rt SLS reaching forward toward ~3 ft surface 2 sets of 10     Walking with Sports Cord  10 reps  straight back; 10 reps each side for side steps       Knee/Hip Exercises: Seated   Sit to Sand  5 reps;without UE support;3 sets focus on eccentric lowering from ~ 2.5 ft       Electrical Stimulation   Electrical Stimulation Location  Rt knee     Electrical Stimulation Action  IFC    Electrical Stimulation Parameters  to tolerance    Electrical Stimulation Goals  Pain;Tone      Vasopneumatic   Number Minutes Vasopneumatic   15 minutes    Vasopnuematic Location   Knee Rt    Vasopneumatic Pressure  Low    Vasopneumatic Temperature   34 deg      Manual Therapy   Manual therapy comments  transverse friction massage lateral distal quad at patella - instruction for home              PT Education - 09/25/17 1743  Education provided  Yes    Education Details  HEP     Person(s) Educated  Patient    Methods  Explanation;Demonstration;Tactile cues;Verbal cues;Handout    Comprehension  Verbalized understanding;Returned demonstration;Verbal cues required;Tactile cues required          PT Long Term Goals - 09/22/17 1557      PT LONG TERM GOAL #1   Title  Improve quad extensibility to 125 deg knee flexion in prone for bilat LE's 10/11/17     Time  6    Period  Weeks    Status  On-going      PT LONG TERM GOAL #2   Title  Decrease pain with functional activities including sitting, standing; walking ; ascending and descending stairs to 0/10 to 2/10 at most 10/11/17    Time  6    Period  Weeks    Status  On-going      PT LONG TERM GOAL #3   Title  Patient reports return to regular exercise including zumba class(for 20-30 min class) 10/11/17    Time  6    Period  Weeks    Status  On-going      PT LONG TERM GOAL #4   Title  Independent in HEP 10/11/17    Time  6    Period  Weeks    Status  On-going      PT LONG TERM GOAL #5   Title  Improve FOTO to </=41% limitation 10/11/17    Time  6    Period  Weeks    Status  On-going            Plan - 09/25/17 1706     Clinical Impression Statement  Good progress with less knee pain and improved function. She is pleased with her progress. Glad she is finally improving. Progressing well toward stated goals of therapy     Rehab Potential  Good    PT Frequency  2x / week    PT Duration  6 weeks    PT Treatment/Interventions  Patient/family education;ADLs/Self Care Home Management;Cryotherapy;Electrical Stimulation;Iontophoresis 4mg /ml Dexamethasone;Moist Heat;Ultrasound;Dry needling;Manual techniques;Neuromuscular re-education;Therapeutic activities;Therapeutic exercise;Taping    PT Next Visit Plan  myofacial release and manual work for [employer]; continue estim/vaso//US/ionto/ice/heat as indicated    Consulted and Agree with Plan of Care  Patient       Patient will benefit from skilled therapeutic intervention in order to improve the following deficits and impairments:  Improper body mechanics, Impaired UE functional use, Increased muscle spasms, Increased fascial restricitons, Decreased mobility, Decreased range of motion, Abnormal gait, Decreased activity tolerance, Pain  Visit Diagnosis: Acute pain of right knee  Other symptoms and signs involving the musculoskeletal system  Other abnormalities of gait and mobility     Problem List Patient Active Problem List   Diagnosis Date Noted  . Patellofemoral pain syndrome 07/29/2017  . Chronic pain of right knee 07/03/2017  . Former smoker, stopped smoking many years ago 07/03/2017  . Allergic rhinitis 11/17/2015  . History of motion sickness 11/17/2015    Recia Sons Rober MinionP Mckyla Deckman PT, MPH  09/25/2017, 5:44 PM  [facility] [address] [place], [place], [address] Phone: [phone]   Fax:  [phone]  Name: Natalie MassedMelissa Daniels MRN: [id number] Date of Birth: June 25, 1970

## 2017-10-01 ENCOUNTER — Ambulatory Visit: Payer: 59 | Admitting: Rehabilitative and Restorative Service Providers"

## 2017-10-01 DIAGNOSIS — R29898 Other symptoms and signs involving the musculoskeletal system: Secondary | ICD-10-CM

## 2017-10-01 DIAGNOSIS — M25561 Pain in right knee: Secondary | ICD-10-CM

## 2017-10-01 DIAGNOSIS — R2689 Other abnormalities of gait and mobility: Secondary | ICD-10-CM

## 2017-10-01 NOTE — Patient Instructions (Addendum)
Balance: Unilateral - Foam    Eyes open, balance with right leg on dense foam. Hold __30-45__ seconds. Repeat _3___ times per set. Do ___1-2_ sessions per day. Perform exercise with eyes closed.   Mini Squat: With Harley-DavidsonBall Squeeze    Stand with ball between knees. Squat with head up,back sliding along the wall - move slowly.  Repeat __10__ times per set. Do __1-2__ sets per session. Do __1__ sessions per day.  side steps with blue band

## 2017-10-01 NOTE — Therapy (Signed)
[facility] [place] [address] [place], [place], [address] Phone: [phone]   Fax:  [phone]  Physical Therapy Treatment  Patient Details  Name: Natalie Daniels MRN: [id number] Date of Birth: 27-Jul-1970 Referring Provider: Dr Clementeen Graham    Encounter Date: 10/01/2017  PT End of Session - 10/01/17 1608    Visit Number  7    Number of Visits  12    Date for PT Re-Evaluation  10/11/17    PT Start Time  1600    PT Stop Time  1657    PT Time Calculation (min)  57 min    Activity Tolerance  Patient tolerated treatment well       Past Medical History:  Diagnosis Date  . Constipation   . Plantar fasciitis   . Rosacea     Past Surgical History:  Procedure Laterality Date  . ANTERIOR FUSION LUMBAR SPINE    . BACK SURGERY    . CESAREAN SECTION    . COLONOSCOPY W/ POLYPECTOMY     age 78    There were no vitals filed for this visit.  Subjective Assessment - 10/01/17 1609    Subjective  Continues to improve. Less pain in the Rt knee. Able to cross her leg without pain this weekend.     Currently in Pain?  Yes    Pain Score  1     Pain Location  Knee    Pain Orientation  Right    Pain Descriptors / Indicators  Aching;Sore    Pain Type  Acute pain    Pain Onset  More than a month ago    Pain Frequency  Intermittent         [facility] PT Assessment - 10/01/17 0001      Assessment   Medical Diagnosis  Rt patellofemoral knee pain     Referring Provider  Dr Clementeen Graham     Onset Date/Surgical Date  05/10/17    Hand Dominance  Right    Next MD Visit  PRN     Prior Therapy  none for knee       Flexibility   Quadriceps  Rt 124 degrees       Palpation   Palpation comment  muscular tightness through Rt quad and banding through the lateral distal quad                    [facility] Adult PT Treatment/Exercise - 10/01/17 0001      Knee/Hip Exercises: Stretches   Lobbyist  Right;2 reps;30 seconds prone with  strap       Knee/Hip Exercises: Aerobic   Recumbent Bike  L3 x 6 min       Knee/Hip Exercises: Standing   Hip Abduction  Stengthening;Right;Left;5 sets;5 reps 20-25 ft with blue TB above knees     Lateral Step Up  Right;Left;10 reps;2 sets;Hand Hold: 1;Hand Hold: 0;Step Height: 6" heel touch only     Forward Step Up  Right;10 reps;Hand Hold: 2;2 sets;Step Height: 6" one set Lt     Step Down  Left;2 sets;10 reps;Hand Hold: 2;Step Height: 4"    SLS  Rt/Lt 30 sec x 3 reps each LE w/ UE support as needed  grey theracushion    SLS with Vectors  Rt SLS reaching forward toward ~3 ft surface 2 sets of 10     Walking with Sports Cord  10 reps straight back; 10 reps each side for side steps  Knee/Hip Exercises: Seated   Sit to Sand  without UE support;10 reps focus on eccentric lowering from ~ 2.5 ft       Electrical Stimulation   Electrical Stimulation Location  Rt knee     Electrical Stimulation Action  IFC    Electrical Stimulation Parameters  to tolerance    Electrical Stimulation Goals  Pain;Tone      Vasopneumatic   Number Minutes Vasopneumatic   15 minutes    Vasopnuematic Location   Knee Rt    Vasopneumatic Pressure  Low    Vasopneumatic Temperature   34 deg      Manual Therapy   Manual therapy comments  transverse friction massage lateral distal quad at patella - instruction for home              PT Education - 10/01/17 1653    Education provided  Yes    Education Details  HEP     Person(s) Educated  Patient    Methods  Explanation;Demonstration;Tactile cues;Verbal cues    Comprehension  Verbalized understanding;Returned demonstration;Verbal cues required;Tactile cues required          PT Long Term Goals - 10/01/17 1607      PT LONG TERM GOAL #1   Title  Improve quad extensibility to 125 deg knee flexion in prone for bilat LE's 10/11/17     Time  6    Period  Weeks    Status  On-going      PT LONG TERM GOAL #2   Title  Decrease pain with functional  activities including sitting, standing; walking; ascending and descending stairs to 0/10 to 2/10 at most 10/11/17    Time  6    Period  Weeks    Status  On-going      PT LONG TERM GOAL #3   Title  Patient reports return to regular exercise including zumba class(for 20-30 min class) 10/11/17    Time  6    Period  Weeks    Status  On-going      PT LONG TERM GOAL #4   Title  Independent in HEP 10/11/17    Time  6    Period  Weeks    Status  On-going      PT LONG TERM GOAL #5   Title  Improve FOTO to </=41% limitation 10/11/17    Time  6    Period  Weeks    Status  On-going            Plan - 10/01/17 1611    Clinical Impression Statement  Progressing well with knee rehab with less pain and improved functional activity. Has a pool at home but has not been in the pool because the water is not warm enough.     Rehab Potential  Good    PT Frequency  2x / week    PT Duration  6 weeks    PT Treatment/Interventions  Patient/family education;ADLs/Self Care Home Management;Cryotherapy;Electrical Stimulation;Iontophoresis 4mg /ml Dexamethasone;Moist Heat;Ultrasound;Dry needling;Manual techniques;Neuromuscular re-education;Therapeutic activities;Therapeutic exercise;Taping    PT Next Visit Plan  myofacial release and manual work for quads; continue estim/vaso//US/ionto/ice/heat as indicated    Consulted and Agree with Plan of Care  Patient       Patient will benefit from skilled therapeutic intervention in order to improve the following deficits and impairments:  Improper body mechanics, Impaired UE functional use, Increased muscle spasms, Increased fascial restricitons, Decreased mobility, Decreased range of motion, Abnormal gait, Decreased activity tolerance, Pain  Visit Diagnosis: Acute pain of right knee  Other symptoms and signs involving the musculoskeletal system  Other abnormalities of gait and mobility     Problem List Patient Active Problem List   Diagnosis Date Noted  .  Patellofemoral pain syndrome 07/29/2017  . Chronic pain of right knee 07/03/2017  . Former smoker, stopped smoking many years ago 07/03/2017  . Allergic rhinitis 11/17/2015  . History of motion sickness 11/17/2015    Goebel Hellums Rober MinionP Merwyn Hodapp PT, MPH  10/01/2017, 4:58 PM  [facility] [address] [place], [place], [address] Phone: [phone]   Fax:  [phone]  Name: Natalie Daniels MRN: [id number] Date of Birth: 1971/05/20

## 2017-10-09 ENCOUNTER — Encounter: Payer: Self-pay | Admitting: Rehabilitative and Restorative Service Providers"

## 2017-10-09 ENCOUNTER — Ambulatory Visit: Payer: 59 | Admitting: Rehabilitative and Restorative Service Providers"

## 2017-10-09 DIAGNOSIS — R2689 Other abnormalities of gait and mobility: Secondary | ICD-10-CM

## 2017-10-09 DIAGNOSIS — R29898 Other symptoms and signs involving the musculoskeletal system: Secondary | ICD-10-CM | POA: Diagnosis not present

## 2017-10-09 DIAGNOSIS — M25561 Pain in right knee: Secondary | ICD-10-CM | POA: Diagnosis not present

## 2017-10-09 NOTE — Therapy (Signed)
[facility] [place] [address] [place], [place], [address] Phone: [phone]   Fax:  [phone]  Physical Therapy Treatment  Patient Details  Name: Natalie Daniels MRN: [id number] Date of Birth: [birth year] Referring Provider: Dr Clementeen Graham    Encounter Date: 10/09/2017  PT End of Session - 10/09/17 1658    Visit Number  8    Number of Visits  12    Date for PT Re-Evaluation  10/11/17    PT Start Time  1658    PT Stop Time  1755    PT Time Calculation (min)  57 min    Activity Tolerance  Patient tolerated treatment well       Past Medical History:  Diagnosis Date  . Constipation   . Plantar fasciitis   . Rosacea     Past Surgical History:  Procedure Laterality Date  . ANTERIOR FUSION LUMBAR SPINE    . BACK SURGERY    . CESAREAN SECTION    . COLONOSCOPY W/ POLYPECTOMY     age 3    There were no vitals filed for this visit.  Subjective Assessment - 10/09/17 1702    Subjective  No leg pain. Working on her exercises at home. Was out of town for a few days and was not able to do her exercises like she is at home.     Currently in Pain?  No/denies         [facility] PT Assessment - 10/09/17 0001      Assessment   Medical Diagnosis  Rt patellofemoral knee pain     Referring Provider  Dr Clementeen Graham     Onset Date/Surgical Date  05/10/17    Hand Dominance  Right    Next MD Visit  PRN     Prior Therapy  none for knee       Strength   Right/Left Hip  -- 5/5 bilat LE's     Right/Left Knee  -- 5/5 bilat LE's     Right/Left Ankle  -- 5/5 bilat LE's       Flexibility   Hamstrings  tight bilat ~ 80 deg     Quadriceps  Rt 137 degrees     ITB  minimal tightness Rt     Piriformis  tightness Rt       Palpation   Palpation comment  muscular tightness through Rt quad and banding through the lateral distal quad                    [facility] Adult PT Treatment/Exercise - 10/09/17 0001      Knee/Hip Exercises:  Stretches   Passive Hamstring Stretch  Right;2 reps;30 seconds    Quad Stretch  Right;2 reps;30 seconds prone with strap     ITB Stretch  Right;2 reps;30 seconds      Knee/Hip Exercises: Aerobic   Elliptical  L2 x 5 min       Knee/Hip Exercises: Standing   Lateral Step Up  Right;Left;10 reps;2 sets;Hand Hold: 1;Hand Hold: 0;Step Height: 6" heel touch only     Forward Step Up  Right;10 reps;Hand Hold: 2;2 sets;Step Height: 6" one set Lt     Step Down  Left;2 sets;10 reps;Hand Hold: 2;Step Height: 4"    Walking with Sports Cord  10 reps straight back; 10 reps each side for side steps     Other Standing Knee Exercises  stepping laterally over and back on BOSU ball x 2  min UE support     Other Standing Knee Exercises  stepping laterally with 2# weight working on zumba moves ~ 5 min  mild discomfort Rt knee       Electrical Stimulation   Electrical Stimulation Location  Rt knee     Electrical Stimulation Action  IFC    Electrical Stimulation Parameters  to tolerance    Electrical Stimulation Goals  Pain;Tone      Vasopneumatic   Number Minutes Vasopneumatic   15 minutes    Vasopnuematic Location   Knee Rt    Vasopneumatic Pressure  Low    Vasopneumatic Temperature   34 deg      Manual Therapy   Manual therapy comments  transverse friction massage lateral distal quad at patella - instruction for home  significant decrease in palpable tightness lat/distal quad                  PT Long Term Goals - 10/09/17 1658      PT LONG TERM GOAL #1   Title  Improve quad extensibility to 125 deg knee flexion in prone for bilat LE's 10/11/17     Time  6    Period  Weeks    Status  Achieved      PT LONG TERM GOAL #2   Title  Decrease pain with functional activities including sitting, standing; walking; ascending and descending stairs to 0/10 to 2/10 at most 10/11/17    Time  6    Period  Weeks    Status  Achieved      PT LONG TERM GOAL #3   Title  Patient reports return to regular  exercise including zumba class(for 20-30 min class) 10/11/17    Time  6    Period  Weeks    Status  On-going      PT LONG TERM GOAL #4   Title  Independent in HEP 10/11/17    Time  6    Period  Weeks    Status  On-going      PT LONG TERM GOAL #5   Title  Improve FOTO to </=41% limitation 10/11/17    Time  6    Period  Weeks    Status  On-going            Plan - 10/09/17 1704    Clinical Impression Statement  Continued progress with decreased pain with functional activity. ROM and strength are WFL's and continue to improve gradually. Patient is peased with progress and feels she will be ready to go on her own in the next week or so. Progressing well toward goals having accomplished Goals 1 & 2 and is progressing well toward remainder of goals.     Rehab Potential  Good    PT Frequency  2x / week    PT Duration  6 weeks    PT Treatment/Interventions  Patient/family education;ADLs/Self Care Home Management;Cryotherapy;Electrical Stimulation;Iontophoresis 4mg /ml Dexamethasone;Moist Heat;Ultrasound;Dry needling;Manual techniques;Neuromuscular re-education;Therapeutic activities;Therapeutic exercise;Taping    PT Next Visit Plan  LE strengthening and stretching; myofacial release and manual work for [employer]; continue estim/vaso//US/ionto/ice/heat as indicated    Financial planner with Plan of Care  Patient       Patient will benefit from skilled therapeutic intervention in order to improve the following deficits and impairments:  Improper body mechanics, Impaired UE functional use, Increased muscle spasms, Increased fascial restricitons, Decreased mobility, Decreased range of motion, Abnormal gait, Decreased activity tolerance, Pain  Visit Diagnosis: Acute pain  of right knee  Other symptoms and signs involving the musculoskeletal system  Other abnormalities of gait and mobility     Problem List Patient Active Problem List   Diagnosis Date Noted  . Patellofemoral pain syndrome  07/29/2017  . Chronic pain of right knee 07/03/2017  . Former smoker, stopped smoking many years ago 07/03/2017  . Allergic rhinitis 11/17/2015  . History of motion sickness 11/17/2015    Bayan Kushnir Rober MinionP Sanyah Molnar PT, MPH  10/09/2017, 5:45 PM  [facility] [address] [place], [place], [address] Phone: [phone]   Fax:  [phone]  Name: Natalie Daniels MRN: [id number] Date of Birth: 1970/10/09

## 2017-10-13 ENCOUNTER — Ambulatory Visit: Payer: 59 | Admitting: Rehabilitative and Restorative Service Providers"

## 2017-10-13 DIAGNOSIS — R29898 Other symptoms and signs involving the musculoskeletal system: Secondary | ICD-10-CM | POA: Diagnosis not present

## 2017-10-13 DIAGNOSIS — M25561 Pain in right knee: Secondary | ICD-10-CM | POA: Diagnosis not present

## 2017-10-13 DIAGNOSIS — R2689 Other abnormalities of gait and mobility: Secondary | ICD-10-CM

## 2017-10-13 NOTE — Therapy (Addendum)
[place], [place], [address] Phone: [phone]   Fax:  [phone]  Physical Therapy Treatment  Patient Details  Name: Natalie Daniels MRN: [id number] Date of Birth: [birth year] Referring Provider: Dr Lynne Leader    Encounter Date: 10/13/2017  PT End of Session - 10/13/17 1607    Visit Number  9    Number of Visits  12    Date for PT Re-Evaluation  10/11/17    PT Start Time  6378    PT Stop Time  1756    PT Time Calculation (min)  58 min    Activity Tolerance  Patient tolerated treatment well       Past Medical History:  Diagnosis Date  . Constipation   . Plantar fasciitis   . Rosacea     Past Surgical History:  Procedure Laterality Date  . ANTERIOR FUSION LUMBAR SPINE    . BACK SURGERY    . CESAREAN SECTION    . COLONOSCOPY W/ POLYPECTOMY     age 49    There were no vitals filed for this visit.  Subjective Assessment - 10/13/17 1619    Subjective  No leg pain. Working on her exercises at home. Did 15 min of zumba over the weekend and worked on an outdoor project with her husband with no pain. Pleased with progress. Plans to continue with her HEP and add water exercise when the pool is warm enough.     Currently in Pain?  No/denies         [facility] PT Assessment - 10/13/17 0001      Assessment   Medical Diagnosis  Rt patellofemoral knee pain     Referring Provider  Dr Lynne Leader     Onset Date/Surgical Date  05/10/17    Hand Dominance  Right    Next MD Visit  PRN     Prior Therapy  none for knee       Observation/Other Assessments   Focus on Therapeutic Outcomes (FOTO)   11% limitation       Strength   Right/Left Hip  -- 5/5 bilat hips     Right/Left Knee  -- 5/5 bilat knee flex/ext     Right/Left Ankle  -- 5/5 bilat ankles       Flexibility   Hamstrings  tight bilat ~ 80 deg     Quadriceps  Rt 142 degrees     ITB  minimal tightness Rt     Piriformis  tightness Rt        Palpation   Palpation comment  minimal muscular tightness through Rt quad and banding through the lateral distal quad                    [facility] Adult PT Treatment/Exercise - 10/13/17 0001      Knee/Hip Exercises: Stretches   Sports administrator  Right;2 reps;30 seconds prone with strap       Knee/Hip Exercises: Aerobic   Elliptical  L3 x 5 min       Knee/Hip Exercises: Standing   Lateral Step Up  Right;Left;10 reps;2 sets;Hand Hold: 1;Hand Hold: 0;Step Height: 6" heel touch only     Forward Step Up  Right;10 reps;Hand Hold: 2;2 sets;Step Height: 6" one set Lt     Step Down  Left;2 sets;10 reps;Hand Hold: 2;Step Height: 4"    Wall Squat  2 sets;10 reps small orange ball btn knees  SLS  Rt/Lt 30 sec x 3 reps each LE w/ UE support as needed     SLS with Vectors  Rt SLS reaching forward toward ~3 ft surface 2 sets of 10     Walking with Sports Cord  10 reps straight back; 10 reps each side for side steps       Knee/Hip Exercises: Seated   Sit to Sand  without UE support;10 reps slow eccentric control       Electrical Stimulation   Electrical Stimulation Location  Rt knee     Electrical Stimulation Action  IFC    Electrical Stimulation Parameters  to tolerance    Electrical Stimulation Goals  Tone prevent delayed onset muscle soreness       Vasopneumatic   Number Minutes Vasopneumatic   15 minutes to prevent delayed onset muscle soreness     Vasopnuematic Location   Knee Rt    Vasopneumatic Pressure  Low    Vasopneumatic Temperature   34 deg             PT Education - 10/13/17 1639    Education provided  Yes    Education Details  HEP review     Person(s) Educated  Patient    Methods  Explanation;Demonstration;Tactile cues;Verbal cues    Comprehension  Verbalized understanding;Returned demonstration;Verbal cues required;Tactile cues required          PT Long Term Goals - 10/13/17 1616      PT LONG TERM GOAL #1   Title  Improve quad extensibility to 125  deg knee flexion in prone for bilat LE's 10/11/17     Time  6    Period  Weeks    Status  Achieved      PT LONG TERM GOAL #2   Title  Decrease pain with functional activities including sitting, standing; walking; ascending and descending stairs to 0/10 to 2/10 at most 10/11/17    Time  6    Period  Weeks    Status  Achieved      PT LONG TERM GOAL #3   Title  Patient reports return to regular exercise including zumba class(for 20-30 min class) 10/11/17 - Did 15 min of zumba     Time  6    Period  Weeks    Status  Partially Met      PT LONG TERM GOAL #4   Title  Independent in HEP 10/11/17    Time  6    Period  Weeks    Status  Achieved      PT LONG TERM GOAL #5   Title  Improve FOTO to </=41% limitation 10/11/17    Time  6    Period  Weeks    Status  Achieved            Plan - 10/13/17 1610    Clinical Impression Statement  Excellent progress - especially in the past 2-3 weeks. No pain, good strength, good ROM, decreased palpable tightness in the lateral thigh at patella, Goals of therapy have been accomplished. Patient has returned to all functional activities. Independent in HEP.     Rehab Potential  Good    PT Frequency  2x / week    PT Duration  6 weeks    PT Treatment/Interventions  Patient/family education;ADLs/Self Care Home Management;Cryotherapy;Electrical Stimulation;Iontophoresis 19m/ml Dexamethasone;Moist Heat;Ultrasound;Dry needling;Manual techniques;Neuromuscular re-education;Therapeutic activities;Therapeutic exercise;Taping    PT Next Visit Plan  patient will continue with independent HEP and call with any  questions or problems     Consulted and Agree with Plan of Care  Patient       Patient will benefit from skilled therapeutic intervention in order to improve the following deficits and impairments:  Improper body mechanics, Impaired UE functional use, Increased muscle spasms, Increased fascial restricitons, Decreased mobility, Decreased range of motion,  Abnormal gait, Decreased activity tolerance, Pain  Visit Diagnosis: Acute pain of right knee  Other symptoms and signs involving the musculoskeletal system  Other abnormalities of gait and mobility     Problem List Patient Active Problem List   Diagnosis Date Noted  . Patellofemoral pain syndrome 07/29/2017  . Chronic pain of right knee 07/03/2017  . Former smoker, stopped smoking many years ago 07/03/2017  . Allergic rhinitis 11/17/2015  . History of motion sickness 11/17/2015    Celyn Nilda Simmer PT, MPH  10/13/2017, 4:46 PM  [facility] [place], [place], [address] Phone: [phone]   Fax:  [phone]  Name: Natalie Daniels MRN: [id number] Date of Birth: August 25, 1970  PHYSICAL THERAPY DISCHARGE SUMMARY  Visits from Start of Care: 9  Current functional level related to goals / functional outcomes: See last progress note for discharge status    Remaining deficits: No known deficits    Education / Equipment: HEP  Plan: Patient agrees to discharge.  Patient goals were met. Patient is being discharged due to meeting the stated rehab goals.  ?????     Celyn P. Helene Kelp PT, MPH 10/29/17 12:59 PM

## 2017-10-16 ENCOUNTER — Encounter: Payer: 59 | Admitting: Rehabilitative and Restorative Service Providers"

## 2018-05-04 ENCOUNTER — Ambulatory Visit (INDEPENDENT_AMBULATORY_CARE_PROVIDER_SITE_OTHER): Payer: 59 | Admitting: Physician Assistant

## 2018-05-04 ENCOUNTER — Encounter: Payer: Self-pay | Admitting: Physician Assistant

## 2018-05-04 VITALS — BP 143/84 | HR 87 | Temp 98.9°F | Wt 165.0 lb

## 2018-05-04 DIAGNOSIS — J069 Acute upper respiratory infection, unspecified: Secondary | ICD-10-CM | POA: Diagnosis not present

## 2018-05-04 LAB — POCT RAPID STREP A (OFFICE): RAPID STREP A SCREEN: NEGATIVE

## 2018-05-04 MED ORDER — PREDNISONE 20 MG PO TABS: 40.0000 mg | ORAL_TABLET | Freq: Every day | ORAL | 0 refills | Status: AC

## 2018-05-04 MED ORDER — IPRATROPIUM BROMIDE 0.06 % NA SOLN: 2.0000 | Freq: Four times a day (QID) | NASAL | 0 refills | Status: DC | PRN

## 2018-05-04 NOTE — Progress Notes (Signed)
HPI:                                                                Natalie Daniels is a 47 y.o. female who presents to [facility]: [facility] Medicine today for sore throat / ear pain  Sore Throat   This is a new problem. The current episode started in the past 7 days. The problem has been unchanged. The pain is worse on the left side. There has been no fever. The pain is mild (+ intermittent). Associated symptoms include congestion and ear pain. Pertinent negatives include no coughing, hoarse voice, shortness of breath, swollen glands or trouble swallowing. Associated symptoms comments: + post-nasal drip + chills. She has had no exposure to strep. Exposure to: bronchitis. She has tried nothing for the symptoms.    Past Medical History:  Diagnosis Date  . Constipation   . Plantar fasciitis   . Rosacea    Past Surgical History:  Procedure Laterality Date  . ANTERIOR FUSION LUMBAR SPINE    . BACK SURGERY    . CESAREAN SECTION    . COLONOSCOPY W/ POLYPECTOMY     age 69   Social History   Tobacco Use  . Smoking status: Former Smoker    Packs/day: 1.00    Years: 8.00    Pack years: 8.00    Types: Cigarettes    Last attempt to quit: 06/17/1994    Years since quitting: 23.8  . Smokeless tobacco: Never Used  Substance Use Topics  . Alcohol use: Yes    Comment: <1 month   family history includes Alcohol abuse in her father; Diabetes in her father; Endometriosis in her mother; Heart attack in her father, paternal aunt, and paternal grandfather; Stroke in her maternal grandfather; Thyroid disease in her mother.    ROS: negative except as noted in the HPI  Medications: Current Outpatient Medications  Medication Sig Dispense Refill  . Ascorbic Acid (VITAMIN C PO) Take by mouth.    . diclofenac sodium (VOLTAREN) 1 % GEL Apply 4 g topically 4 (four) times daily. To affected joint. 540 g 3  . Docusate Calcium (STOOL SOFTENER PO) Take by mouth.    .  Drospirenone-Ethinyl Estradiol (OCELLA PO) Take by mouth.    Marland Kitchen ipratropium (ATROVENT) 0.06 % nasal spray Place 2 sprays into both nostrils 4 (four) times daily as needed for rhinitis. 15 mL 0  . metroNIDAZOLE (METROGEL) 1 % gel metronidazole 1 % topical gel    . Multiple Vitamin (MULTIVITAMIN) capsule Take 1 capsule by mouth daily.    . Omega-3 Fatty Acids (FISH OIL PO) Take by mouth.    . predniSONE (DELTASONE) 20 MG tablet Take 2 tablets (40 mg total) by mouth daily with breakfast for 5 days. 10 tablet 0  . Probiotic Product (PROBIOTIC DAILY PO) Take by mouth.     No current facility-administered medications for this visit.    Allergies  Allergen Reactions  . Aspirin Nausea And Vomiting  . Diclofenac Hives and Itching  . Doxycycline Hives       Objective:  BP (!) 143/84   Pulse 87   Temp 98.9 F (37.2 C) (Oral)   Wt 165 lb (74.8 kg)   BMI 25.09 kg/m  Gen:  alert, not ill-appearing, no distress, appropriate for age HEENT: head normocephalic without obvious abnormality, conjunctiva and cornea clear, TM's pearly gray and semi-transparent, no sinus tenderness, oropharynx with mild redness, no edema, tonsils grade 1+, no exudates, neck supple, no cervical adenopathy, trachea midline Pulm: Normal work of breathing, normal phonation, clear to auscultation bilaterally, no wheezes, rales or rhonchi CV: Normal rate, regular rhythm, s1 and s2 distinct, no murmurs, clicks or rubs  Neuro: alert and oriented x 3, no tremor MSK: extremities atraumatic, normal gait and station Skin: intact, no rashes on exposed skin, no jaundice, no cyanosis     No results found for this or any previous visit (from the past 72 hour(s)). No results found.    Assessment and Plan: 47 y.o. female with   .Diagnoses and all orders for this visit:  Acute upper respiratory infection -     POCT rapid strep A -     predniSONE (DELTASONE) 20 MG tablet; Take 2 tablets (40 mg total) by mouth daily with  breakfast for 5 days. -     ipratropium (ATROVENT) 0.06 % nasal spray; Place 2 sprays into both nostrils 4 (four) times daily as needed for rhinitis.   POC Strep A negative Otalgia is likely referred pain from viral pharyngitis Supportive care Nasal toileting for post-nasal drip F/u if no improvement in the next 3 days  Patient education and anticipatory guidance given Patient agrees with treatment plan Follow-up as needed if symptoms worsen or fail to improve  Levonne Hubertharley E. Cummings PA-C

## 2018-05-04 NOTE — Patient Instructions (Signed)
For nasal symptoms/sinusitis: - prescription Atrovent nasal spray: 2 sprays each nostril, up to 4 times per day as needed - warning: do not use OTC nasal sprays like Afrin (oxymetazoline) for more than 3 days as it will cause worsening congestion/nasal symptoms) - nasal saline rinses / netti pot (do this prior to nasal spray) - warm facial compresses - oral decongestants and antihistamines like Claritin-D and Zyrtec-D may help dry up secretions (caution using decongestants if you have high blood pressure, heart disease or kidney disease) - for sinus headache: Tylenol 1000mg  every 8 hours as needed  For sore throat: - Tylenol 1000mg  every 8 hours as needed for throat pain. - Cepacol throat lozenges and/or Chloraseptic spray - Warm salt water gargles   Note: follow package instructions for all over-the-counter medications. If using multi-symptom medications (Dayquil, Theraflu, etc.), check the label for duplicate drug ingredients.

## 2018-05-07 ENCOUNTER — Encounter: Payer: Self-pay | Admitting: Physician Assistant

## 2018-05-13 ENCOUNTER — Encounter: Payer: Self-pay | Admitting: Physician Assistant

## 2018-05-26 ENCOUNTER — Other Ambulatory Visit: Payer: Self-pay | Admitting: Physician Assistant

## 2018-05-26 DIAGNOSIS — J069 Acute upper respiratory infection, unspecified: Secondary | ICD-10-CM

## 2018-07-22 ENCOUNTER — Telehealth: Payer: Self-pay

## 2018-07-22 DIAGNOSIS — R69 Illness, unspecified: Principal | ICD-10-CM

## 2018-07-22 DIAGNOSIS — J111 Influenza due to unidentified influenza virus with other respiratory manifestations: Secondary | ICD-10-CM

## 2018-07-22 MED ORDER — OSELTAMIVIR PHOSPHATE 75 MG PO CAPS: 75.0000 mg | ORAL_CAPSULE | Freq: Two times a day (BID) | ORAL | 0 refills | Status: AC

## 2018-07-22 NOTE — Telephone Encounter (Signed)
Tamiflu sent to local pharmacy. Return precautions: Fever above 103, chest pain, breathing difficulties, severe headache, neck stiffness, change in mental status 

## 2018-07-22 NOTE — Telephone Encounter (Signed)
Patient advised of recommendations.  

## 2018-07-22 NOTE — Telephone Encounter (Signed)
Natalie Daniels complains of flu like symptoms. She reports fever 101.9, chills, bodyaches, sweats, fatigue and headache for 2 days. She was at an international game with her daughter a week ago. She has had a flu vaccine.

## 2018-08-10 ENCOUNTER — Encounter: Payer: Self-pay | Admitting: Physician Assistant

## 2018-08-10 ENCOUNTER — Ambulatory Visit (INDEPENDENT_AMBULATORY_CARE_PROVIDER_SITE_OTHER): Payer: 59 | Admitting: Physician Assistant

## 2018-08-10 VITALS — BP 146/88 | HR 76 | Temp 98.5°F | Wt 162.0 lb

## 2018-08-10 DIAGNOSIS — M940 Chondrocostal junction syndrome [Tietze]: Secondary | ICD-10-CM | POA: Diagnosis not present

## 2018-08-10 DIAGNOSIS — R058 Other specified cough: Secondary | ICD-10-CM

## 2018-08-10 DIAGNOSIS — R05 Cough: Secondary | ICD-10-CM | POA: Diagnosis not present

## 2018-08-10 DIAGNOSIS — R0982 Postnasal drip: Secondary | ICD-10-CM | POA: Insufficient documentation

## 2018-08-10 DIAGNOSIS — R053 Chronic cough: Secondary | ICD-10-CM

## 2018-08-10 MED ORDER — BUDESONIDE-FORMOTEROL FUMARATE 80-4.5 MCG/ACT IN AERO: 1.0000 | INHALATION_SPRAY | Freq: Two times a day (BID) | RESPIRATORY_TRACT | 0 refills | Status: DC

## 2018-08-10 MED ORDER — IPRATROPIUM BROMIDE 0.02 % IN SOLN
0.5000 mg | Freq: Once | RESPIRATORY_TRACT | Status: AC
  Administered 2018-08-10: 0.5 mg via RESPIRATORY_TRACT

## 2018-08-10 MED ORDER — IPRATROPIUM BROMIDE HFA 17 MCG/ACT IN AERS: 1.0000 | INHALATION_SPRAY | RESPIRATORY_TRACT | 0 refills | Status: DC | PRN

## 2018-08-10 MED ORDER — BENZONATATE 200 MG PO CAPS: 200.0000 mg | ORAL_CAPSULE | Freq: Three times a day (TID) | ORAL | 0 refills | Status: DC | PRN

## 2018-08-10 MED ORDER — HYDROCOD POLST-CPM POLST ER 10-8 MG/5ML PO SUER: 5.0000 mL | Freq: Two times a day (BID) | ORAL | 0 refills | Status: DC | PRN

## 2018-08-10 MED ORDER — IPRATROPIUM BROMIDE 0.06 % NA SOLN: 2.0000 | Freq: Four times a day (QID) | NASAL | 2 refills | Status: DC | PRN

## 2018-08-10 MED ORDER — IPRATROPIUM-ALBUTEROL 0.5-2.5 (3) MG/3ML IN SOLN
3.0000 mL | Freq: Once | RESPIRATORY_TRACT | Status: AC
  Administered 2018-08-10: 3 mL via RESPIRATORY_TRACT

## 2018-08-10 NOTE — Patient Instructions (Addendum)
Try Aleve 2 capsules twice a day for rib pain/costochondritis Apply a heating pad for 30 minutes every 3 hours as needed  Treat your nasal symptoms! Atrovent or Flonase nasal spray Nettipot or saline irrigation  The inhaler is around $400 without insurance. There is unfortunately no cheaper alternative to this exact medication. It is not a "cure" and will not likely reduce the duration of your symptoms. It can just help with symptoms.  If unable to afford this inhaler, would recommend using the Symbicort sample - 1 puff twice a day for 4 weeks  Continue Tessalon every 8 hours as needed Tussionex at bedtime (contains Hydrocodone, a narcotic pain medication. Do not combine with alcohol or other sedatives)  Costochondritis  Costochondritis is swelling and irritation (inflammation) of the tissue (cartilage) that connects your ribs to your breastbone (sternum). This causes pain in the front of your chest. The pain usually starts gradually and involves more than one rib. What are the causes? The exact cause of this condition is not always known. It results from stress on the cartilage where your ribs attach to your sternum. The cause of this stress could be:  Chest injury (trauma).  Exercise or activity, such as lifting.  Severe coughing. What increases the risk? You may be at higher risk for this condition if you:  Are female.  Are 48?48 years old.  Recently started a new exercise or work activity.  Have low levels of vitamin D.  Have a condition that makes you cough frequently. What are the signs or symptoms? The main symptom of this condition is chest pain. The pain:  Usually starts gradually and can be sharp or dull.  Gets worse with deep breathing, coughing, or exercise.  Gets better with rest.  May be worse when you press on the sternum-rib connection (tenderness). How is this diagnosed? This condition is diagnosed based on your symptoms, medical history, and a physical  exam. Your health care provider will check for tenderness when pressing on your sternum. This is the most important finding. You may also have tests to rule out other causes of chest pain. These may include:  A chest X-ray to check for lung problems.  An electrocardiogram (ECG) to see if you have a heart problem that could be causing the pain.  An imaging scan to rule out a chest or rib fracture. How is this treated? This condition usually goes away on its own over time. Your health care provider may prescribe an NSAID to reduce pain and inflammation. Your health care provider may also suggest that you:  Rest and avoid activities that make pain worse.  Apply heat or cold to the area to reduce pain and inflammation.  Do exercises to stretch your chest muscles. If these treatments do not help, your health care provider may inject a numbing medicine at the sternum-rib connection to help relieve the pain. Follow these instructions at home:  Avoid activities that make pain worse. This includes any activities that use chest, abdominal, and side muscles.  If directed, put ice on the painful area: ? Put ice in a plastic bag. ? Place a towel between your skin and the bag. ? Leave the ice on for 20 minutes, 2-3 times a day.  If directed, apply heat to the affected area as often as told by your health care provider. Use the heat source that your health care provider recommends, such as a moist heat pack or a heating pad. ? Place a towel between  your skin and the heat source. ? Leave the heat on for 20-30 minutes. ? Remove the heat if your skin turns bright red. This is especially important if you are unable to feel pain, heat, or cold. You may have a greater risk of getting burned.  Take over-the-counter and prescription medicines only as told by your health care provider.  Return to your normal activities as told by your health care provider. Ask your health care provider what activities are  safe for you.  Keep all follow-up visits as told by your health care provider. This is important. Contact a health care provider if:  You have chills or a fever.  Your pain does not go away or it gets worse.  You have a cough that does not go away (is persistent). Get help right away if:  You have shortness of breath. This information is not intended to replace advice given to you by your health care provider. Make sure you discuss any questions you have with your health care provider. Document Released: 03/13/2005 Document Revised: 03/05/2017 Document Reviewed: 09/27/2015 Elsevier Interactive Patient Education  [employer].

## 2018-08-10 NOTE — Progress Notes (Signed)
HPI:                                                                Natalie Daniels is a 48 y.o. female who presents to [facility]: [facility] today for persistent cough  Nonproductive cough Onset: 4 weeks ago, following influenza infection Frequent throat clearing and PND Developed left-sided rib pain described as sharp and shooting approx 3 days ago No fever, sinus pressure, purulent nasal drainage No dyspnea, hemoptysis, blood-tinged sputum Has tried humidifier, Mucinex and OTC cough suppressants without much relief Tessalon is helpful for a few hours.  Throat lozenges are also mildly helpful  Past Medical History:  Diagnosis Date  . Constipation   . Plantar fasciitis   . Rosacea    Past Surgical History:  Procedure Laterality Date  . ANTERIOR FUSION LUMBAR SPINE    . BACK SURGERY    . CESAREAN SECTION    . COLONOSCOPY W/ POLYPECTOMY     age 81   Social History   Tobacco Use  . Smoking status: Former Smoker    Packs/day: 1.00    Years: 8.00    Pack years: 8.00    Types: Cigarettes    Last attempt to quit: 06/17/1994    Years since quitting: 24.1  . Smokeless tobacco: Never Used  Substance Use Topics  . Alcohol use: Yes    Comment: <1 month   family history includes Alcohol abuse in her father; Diabetes in her father; Endometriosis in her mother; Heart attack in her father, paternal aunt, and paternal grandfather; Stroke in her maternal grandfather; Thyroid disease in her mother.    ROS: negative except as noted in the HPI  Medications: Current Outpatient Medications  Medication Sig Dispense Refill  . Ascorbic Acid (VITAMIN C PO) Take by mouth.    . benzonatate (TESSALON) 200 MG capsule Take 1 capsule (200 mg total) by mouth 3 (three) times daily as needed for cough. 30 capsule 0  . BIOTIN PO biotin    . budesonide-formoterol (SYMBICORT) 80-4.5 MCG/ACT inhaler Inhale 1-2 puffs into the lungs 2 (two) times daily for 28 days.  1 Inhaler 0  . chlorpheniramine-HYDROcodone (TUSSIONEX) 10-8 MG/5ML SUER Take 5 mLs by mouth every 12 (twelve) hours as needed for up to 5 days for cough. 50 mL 0  . diclofenac sodium (VOLTAREN) 1 % GEL Apply 4 g topically 4 (four) times daily. To affected joint. 540 g 3  . Docusate Calcium (STOOL SOFTENER PO) Take by mouth.    . Drospirenone-Ethinyl Estradiol (OCELLA PO) Take by mouth.    Marland Kitchen ipratropium (ATROVENT) 0.06 % nasal spray Place 2 sprays into both nostrils 4 (four) times daily as needed for rhinitis. 15 mL 2  . metroNIDAZOLE (METROGEL) 1 % gel metronidazole 1 % topical gel    . Multiple Vitamin (MULTIVITAMIN) capsule Take 1 capsule by mouth daily.    . Omega-3 Fatty Acids (FISH OIL PO) Take by mouth.    . Probiotic Product (PROBIOTIC DAILY PO) Take by mouth.     No current facility-administered medications for this visit.    Allergies  Allergen Reactions  . Aspirin Nausea And Vomiting  . Diclofenac Hives and Itching  . Doxycycline Hives       Objective:  BP (!) 146/88   Pulse 76   Temp 98.5 F (36.9 C) (Oral)   Wt 162 lb (73.5 kg)   SpO2 99%   BMI 24.63 kg/m  Gen:  alert, not ill-appearing, no distress, appropriate for age HEENT: head normocephalic without obvious abnormality, conjunctiva and cornea clear, wearing glasses, nasal mucosa pink, no maxillary or frontal sinus tenderness, oropharynx clear, uvula midline, neck supple, no cervical adenopathy trachea midline Pulm: Normal work of breathing, normal phonation, clear to auscultation bilaterally, no wheezes, rales or rhonchi CV: Normal rate, regular rhythm, s1 and s2 distinct, no murmurs, clicks or rubs  Neuro: alert and oriented x 3, no tremor MSK: extremities atraumatic, normal gait and station Skin: intact, no rashes on exposed skin, no jaundice, no cyanosis    No results found for this or any previous visit (from the past 72 hour(s)). No results found.    Assessment and Plan: 48 y.o. female with    .Natalie Daniels was seen today for cough.  Diagnoses and all orders for this visit:  Post-viral cough syndrome -     benzonatate (TESSALON) 200 MG capsule; Take 1 capsule (200 mg total) by mouth 3 (three) times daily as needed for cough. -     Discontinue: ipratropium (ATROVENT HFA) 17 MCG/ACT inhaler; Inhale 1 puff into the lungs every 4 (four) hours as needed (cough/bronchospasm). -     chlorpheniramine-HYDROcodone (TUSSIONEX) 10-8 MG/5ML SUER; Take 5 mLs by mouth every 12 (twelve) hours as needed for up to 5 days for cough. -     budesonide-formoterol (SYMBICORT) 80-4.5 MCG/ACT inhaler; Inhale 1-2 puffs into the lungs 2 (two) times daily for 28 days. -     ipratropium-albuterol (DUONEB) 0.5-2.5 (3) MG/3ML nebulizer solution 3 mL -     ipratropium (ATROVENT) nebulizer solution 0.5 mg  Persistent cough for 3 weeks or longer  Acute costochondritis  PND (post-nasal drip) -     ipratropium (ATROVENT) 0.06 % nasal spray; Place 2 sprays into both nostrils 4 (four) times daily as needed for rhinitis.   Afebrile, no tachypnea, no tachycardia, pulse ox 99% on RA at rest, no adventitious lung sounds Counseled on supportive care for post viral cough syndrome Did not have any symptomatic improvement with Atrovent nebulizer She was given a sample of Symbicort and instructed to take 1 to 2 puffs twice daily for the next 2 to 4 weeks Tussionex for nocturnal cough.  Counseled on risks of narcotic pain medication and advised not to combine with alcohol or other sedating medications.  Patient education and anticipatory guidance given Patient agrees with treatment plan Follow-up as needed if symptoms worsen or fail to improve  Levonne Hubert PA-C

## 2018-08-19 ENCOUNTER — Encounter: Payer: Self-pay | Admitting: Physician Assistant

## 2018-08-19 DIAGNOSIS — R058 Other specified cough: Secondary | ICD-10-CM

## 2018-08-19 DIAGNOSIS — R05 Cough: Secondary | ICD-10-CM

## 2018-08-25 MED ORDER — HYDROCOD POLST-CPM POLST ER 10-8 MG/5ML PO SUER: 5.0000 mL | Freq: Two times a day (BID) | ORAL | 0 refills | Status: DC | PRN

## 2018-08-25 MED ORDER — BENZONATATE 200 MG PO CAPS: 200.0000 mg | ORAL_CAPSULE | Freq: Three times a day (TID) | ORAL | 0 refills | Status: DC | PRN

## 2019-04-06 ENCOUNTER — Telehealth: Payer: Self-pay | Admitting: Podiatry

## 2019-04-06 NOTE — Telephone Encounter (Signed)
Pt left message asking about how to go about getting a new pair of orthotics. She got some several years ago and they are starting to show signs of wear.  I left message since it has been over 2 yrs and if we are billing insurance pt would need an appt with Dr Paulla Dolly and to call me to get scheduled.

## 2019-04-14 ENCOUNTER — Other Ambulatory Visit: Payer: Self-pay

## 2019-04-14 ENCOUNTER — Ambulatory Visit: Payer: 59 | Admitting: Orthotics

## 2019-04-14 ENCOUNTER — Ambulatory Visit (INDEPENDENT_AMBULATORY_CARE_PROVIDER_SITE_OTHER): Payer: 59 | Admitting: Podiatry

## 2019-04-14 ENCOUNTER — Encounter: Payer: Self-pay | Admitting: Podiatry

## 2019-04-14 DIAGNOSIS — M7751 Other enthesopathy of right foot: Secondary | ICD-10-CM

## 2019-04-14 DIAGNOSIS — M779 Enthesopathy, unspecified: Secondary | ICD-10-CM

## 2019-04-14 DIAGNOSIS — M7752 Other enthesopathy of left foot: Secondary | ICD-10-CM

## 2019-04-14 DIAGNOSIS — B351 Tinea unguium: Secondary | ICD-10-CM

## 2019-04-14 DIAGNOSIS — M778 Other enthesopathies, not elsewhere classified: Secondary | ICD-10-CM

## 2019-04-14 NOTE — Progress Notes (Signed)
Repeat previous order. 

## 2019-04-16 NOTE — Progress Notes (Signed)
Subjective:   Patient ID: Natalie Daniels, female   DOB: 48 y.o.   MRN: [id number]   HPI Patient states that she is done well with orthotics and wants a new pair made and wants evaluation   ROS      Objective:  Physical Exam  Neurovascular status intact with patient's left second MPJ mildly tender with pressure with mild swelling but much improved with utilization of orthotics that are several years ago     Assessment:  Capsulitis second MPJ left which is kept under good condition with utilization of orthotics     Plan:  Reviewed condition and at this point patient is casted for functional orthotics to try to reduce the stress against the joint surface.  We will see her back when ready and she will be seen by ped orthotist or earlier if needed

## 2019-04-19 ENCOUNTER — Telehealth: Payer: Self-pay

## 2019-04-19 DIAGNOSIS — Z0000 Encounter for general adult medical examination without abnormal findings: Secondary | ICD-10-CM

## 2019-04-19 NOTE — Telephone Encounter (Signed)
Patient is aware that she can get labs before her appointment next week.

## 2019-04-19 NOTE — Telephone Encounter (Signed)
Orders are in for labs if she wants to get blood work done ahead of time

## 2019-04-19 NOTE — Telephone Encounter (Signed)
Pt called stating she has an upcoming annual appt on 04/27/19. Pt is transitioning care from NP Nelson Chimes.

## 2019-04-23 LAB — COMPLETE METABOLIC PANEL WITH GFR
AG Ratio: 1.7 (calc) (ref 1.0–2.5)
ALT: 27 U/L (ref 6–29)
AST: 22 U/L (ref 10–35)
Albumin: 4.4 g/dL (ref 3.6–5.1)
Alkaline phosphatase (APISO): 54 U/L (ref 31–125)
BUN: 16 mg/dL (ref 7–25)
CO2: 30 mmol/L (ref 20–32)
Calcium: 9.1 mg/dL (ref 8.6–10.2)
Chloride: 105 mmol/L (ref 98–110)
Creat: 0.78 mg/dL (ref 0.50–1.10)
GFR, Est African American: 104 mL/min/{1.73_m2} (ref >=60)
GFR, Est Non African American: 90 mL/min/{1.73_m2} (ref >=60)
Globulin: 2.6 g/dL (calc) (ref 1.9–3.7)
Glucose, Bld: 92 mg/dL (ref 65–99)
Potassium: 4.1 mmol/L (ref 3.5–5.3)
Sodium: 140 mmol/L (ref 135–146)
Total Bilirubin: 0.4 mg/dL (ref 0.2–1.2)
Total Protein: 7 g/dL (ref 6.1–8.1)

## 2019-04-23 LAB — LIPID PANEL
Cholesterol: 225 mg/dL — ABNORMAL HIGH (ref <200)
HDL: 74 mg/dL (ref >=50)
LDL Cholesterol (Calc): 133 mg/dL (calc) — ABNORMAL HIGH
Non-HDL Cholesterol (Calc): 151 mg/dL (calc) — ABNORMAL HIGH (ref <130)
Total CHOL/HDL Ratio: 3 (calc) (ref <5.0)
Triglycerides: 84 mg/dL (ref <150)

## 2019-04-23 LAB — CBC
HCT: 40.6 % (ref 35.0–45.0)
Hemoglobin: 13.5 g/dL (ref 11.7–15.5)
MCH: 32.7 pg (ref 27.0–33.0)
MCHC: 33.3 g/dL (ref 32.0–36.0)
MCV: 98.3 fL (ref 80.0–100.0)
MPV: 10.5 fL (ref 7.5–12.5)
Platelets: 236 10*3/uL (ref 140–400)
RBC: 4.13 10*6/uL (ref 3.80–5.10)
RDW: 12.4 % (ref 11.0–15.0)
WBC: 8.1 10*3/uL (ref 3.8–10.8)

## 2019-04-27 ENCOUNTER — Other Ambulatory Visit: Payer: Self-pay

## 2019-04-27 ENCOUNTER — Encounter: Payer: Self-pay | Admitting: Osteopathic Medicine

## 2019-04-27 ENCOUNTER — Ambulatory Visit (INDEPENDENT_AMBULATORY_CARE_PROVIDER_SITE_OTHER): Payer: 59 | Admitting: Osteopathic Medicine

## 2019-04-27 VITALS — BP 123/76 | HR 85 | Temp 99.2°F | Wt 167.0 lb

## 2019-04-27 DIAGNOSIS — Z0000 Encounter for general adult medical examination without abnormal findings: Secondary | ICD-10-CM | POA: Diagnosis not present

## 2019-04-27 DIAGNOSIS — E559 Vitamin D deficiency, unspecified: Secondary | ICD-10-CM

## 2019-04-27 DIAGNOSIS — G43009 Migraine without aura, not intractable, without status migrainosus: Secondary | ICD-10-CM

## 2019-04-27 DIAGNOSIS — F419 Anxiety disorder, unspecified: Secondary | ICD-10-CM | POA: Insufficient documentation

## 2019-04-27 DIAGNOSIS — G4709 Other insomnia: Secondary | ICD-10-CM

## 2019-04-27 DIAGNOSIS — G4489 Other headache syndrome: Secondary | ICD-10-CM

## 2019-04-27 MED ORDER — AMITRIPTYLINE HCL 50 MG PO TABS: ORAL_TABLET | ORAL | 0 refills | Status: DC

## 2019-04-27 MED ORDER — ALPRAZOLAM 0.5 MG PO TABS: 0.2500 mg | ORAL_TABLET | Freq: Two times a day (BID) | ORAL | 0 refills | Status: DC | PRN

## 2019-04-27 MED ORDER — SUMATRIPTAN SUCCINATE 50 MG PO TABS: 25.0000 mg | ORAL_TABLET | ORAL | 2 refills | Status: DC | PRN

## 2019-04-27 NOTE — Progress Notes (Signed)
HPI: Natalie Daniels is a 48 y.o. female who  has a past medical history of Constipation, Plantar fasciitis, and Rosacea.  she presents to [facility] today, 04/27/19,  for chief complaint of: Annual Headaches Insomnia Anxiety     Patient here for annual physical / wellness exam.  See preventive care reviewed as below.  Recent labs reviewed in detail with the patient.   Additional concerns today include:  Headaches: worsening over the past few years, waking her from sleep several nights per week, 3 of migraines, headaches described as typically unilateral usually on the left associated with nausea and sensitivity to light/sound.  Her OB/GYN was prescribing her Fioricet but this does not seem to be very helpful for her. Insomnia: Thinks it might be worse with menopause.  Her OB/GYN was prescribing Ambien but she did not really like taking this medication.  Melatonin seems to be helpful. Anxiety: Worsening over the past couple of years.  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  Is reluctant to be on long-term daily medications.  Past medical, surgical, social and family history reviewed:  Patient Active Problem List   Diagnosis Date Noted  . Vitamin D deficiency 04/27/2019  . Anxiety 04/27/2019  . Other insomnia 04/27/2019  . Migraine without aura and without status migrainosus, not intractable 04/27/2019  . Post-viral cough syndrome 08/10/2018  . Acute costochondritis 08/10/2018  . PND (post-nasal drip) 08/10/2018  . Patellofemoral pain syndrome 07/29/2017  . Chronic pain of right knee 07/03/2017  . Former smoker, stopped smoking many years ago 07/03/2017  . Allergic rhinitis 11/17/2015  . History of motion sickness 11/17/2015    Past Surgical History:  Procedure Laterality Date  . ANTERIOR FUSION LUMBAR SPINE    . BACK SURGERY    . CESAREAN SECTION    . COLONOSCOPY W/ POLYPECTOMY     age 39    Social  History   Tobacco Use  . Smoking status: Former Smoker    Packs/day: 1.00    Years: 8.00    Pack years: 8.00    Types: Cigarettes    Quit date: 06/17/1994    Years since quitting: 24.8  . Smokeless tobacco: Never Used  Substance Use Topics  . Alcohol use: Yes    Comment: <1 month    Family History  Problem Relation Age of Onset  . Endometriosis Mother   . Thyroid disease Mother   . Heart attack Father   . Diabetes Father   . Alcohol abuse Father   . Heart attack Paternal Aunt   . Heart attack Paternal Grandfather   . Stroke Maternal Grandfather   . Cancer Neg Hx      Current medication list and allergy/intolerance information reviewed:    Current Outpatient Medications  Medication Sig Dispense Refill  . Ascorbic Acid (VITAMIN C PO) Take by mouth.    Marland Kitchen BIOTIN PO biotin    . Butalbital-APAP-Caffeine 50-300-40 MG CAPS TAKE ONE CAPSULE EVERY 4 HOURS AS NEEDED FOR HEADACHES    . diclofenac sodium (VOLTAREN) 1 % GEL Apply 4 g topically 4 (four) times daily. To affected joint. 540 g 3  . Docusate Calcium (STOOL SOFTENER PO) Take by mouth.    . Estradiol (YUVAFEM) 10 MCG TABS vaginal tablet Yuvafem 10 mcg vaginal tablet  INSERT 1 TABLET EVERY OTHER DAY BY VAGINAL ROUTE.    . fexofenadine (ALLEGRA) 60 MG tablet Take 60 mg by mouth 2 (two) times daily.    Marland Kitchen  MELATONIN PO Take by mouth.    . metroNIDAZOLE (METROGEL) 1 % gel metronidazole 1 % topical gel    . Multiple Vitamin (MULTIVITAMIN) capsule Take 1 capsule by mouth daily.    . Omega-3 Fatty Acids (FISH OIL PO) Take by mouth.    . Probiotic Product (PROBIOTIC DAILY PO) Take by mouth.    . Sulfacetamide Sodium, Acne, 10 % LOTN 1 APPLICATION TWICE A DAY EXTERNALLY 30 DAYS    . zolpidem (AMBIEN) 5 MG tablet Take 5 mg by mouth at bedtime as needed for sleep.    Marland Kitchen ALPRAZolam (XANAX) 0.5 MG tablet Take 0.5-1 tablets (0.25-0.5 mg total) by mouth 2 (two) times daily as needed for anxiety. 15 tablet 0  . amitriptyline (ELAVIL) 50 MG  tablet Take 1 tablet (50 mg total) by mouth at bedtime for 7 days, THEN 2 tablets (100 mg total) at bedtime for 23 days. 53 tablet 0  . SUMAtriptan (IMITREX) 50 MG tablet Take 0.5-1 tablets (25-50 mg total) by mouth every 2 (two) hours as needed for migraine. May repeat in 2 hours if headache persists or recurs. 10 tablet 2   No current facility-administered medications for this visit.     Allergies  Allergen Reactions  . Aspirin Nausea And Vomiting  . Diclofenac Hives and Itching  . Doxycycline Hives      Review of Systems:  Constitutional:  No  fever, no chills, No recent illness, No unintentional weight changes. No significant fatigue.   HEENT: +headache, no vision change, no hearing change, No sore throat, No  sinus pressure  Cardiac: No  chest pain, No  pressure, No palpitations, No  Orthopnea  Respiratory:  No  shortness of breath. No  Cough  Gastrointestinal: No  abdominal pain, No  nausea, No  vomiting,  No  blood in stool, No  diarrhea, No  constipation   Musculoskeletal: No new myalgia/arthralgia  Skin: No  Rash, No other wounds/concerning lesions  Genitourinary: No  incontinence, No  abnormal genital bleeding, No abnormal genital discharge  Hem/Onc: No  easy bruising/bleeding, No  abnormal lymph node  Endocrine: No cold intolerance,  No heat intolerance. No polyuria/polydipsia/polyphagia   Neurologic: No  weakness, No  dizziness, No  slurred speech/focal weakness/facial droop  Psychiatric: No  concerns with depression, +concerns with anxiety, +sleep problems, No mood problems  Exam:  BP 123/76 (BP Location: Left Arm, Patient Position: Sitting, Cuff Size: Normal)   Pulse 85   Temp 99.2 F (37.3 C) (Oral)   Wt 167 lb 0.6 oz (75.8 kg)   BMI 25.40 kg/m   Constitutional: VS see above. General Appearance: alert, well-developed, well-nourished, NAD  Eyes: Normal lids and conjunctive, non-icteric sclera  Neck: No masses, trachea midline. No thyroid enlargement.  No tenderness/mass appreciated. No lymphadenopathy  Respiratory: Normal respiratory effort. no wheeze, no rhonchi, no rales  Cardiovascular: S1/S2 normal, no murmur, no rub/gallop auscultated. RRR. No lower extremity edema.   Gastrointestinal: Nontender, no masses. No hepatomegaly, no splenomegaly. No hernia appreciated. Bowel sounds normal. Rectal exam deferred.   Musculoskeletal: Gait normal. No clubbing/cyanosis of digits.   Neurological: Normal balance/coordination. No tremor. No cranial nerve deficit on limited exam. Motor and sensation intact and symmetric. Cerebellar reflexes intact.   Skin: warm, dry, intact. No rash/ulcer. No concerning nevi or subq nodules on limited exam.    Psychiatric: Normal judgment/insight. Normal mood and affect. Oriented x3.    No results found for this or any previous visit (from the past 72 hour(s)).  No results found.   ASSESSMENT/PLAN: The primary encounter diagnosis was Annual physical exam. Diagnoses of Migraine without aura and without status migrainosus, not intractable, Other insomnia, Anxiety, Vitamin D deficiency, and Other headache syndrome were also pertinent to this visit.   Orders Placed This Encounter  Procedures  . MR Brain W Wo Contrast  . CBC  . COMPLETE METABOLIC PANEL WITH GFR  . LIPID SCREENING   The 10-year ASCVD risk score Denman George(Goff DC Jr., et al., 2013) is: 0.8%   Values used to calculate the score:     Age: [age] years     Sex: Female     Is Non-Hispanic African American: No     Diabetic: No     Tobacco smoker: No     Systolic Blood Pressure: 123 mmHg     Is BP treated: No     HDL Cholesterol: 74 mg/dL     Total Cholesterol: 225 mg/dL  Meds ordered this encounter  Medications  . SUMAtriptan (IMITREX) 50 MG tablet    Sig: Take 0.5-1 tablets (25-50 mg total) by mouth every 2 (two) hours as needed for migraine. May repeat in 2 hours if headache persists or recurs.    Dispense:  10 tablet    Refill:  2  .  amitriptyline (ELAVIL) 50 MG tablet    Sig: Take 1 tablet (50 mg total) by mouth at bedtime for 7 days, THEN 2 tablets (100 mg total) at bedtime for 23 days.    Dispense:  53 tablet    Refill:  0  . ALPRAZolam (XANAX) 0.5 MG tablet    Sig: Take 0.5-1 tablets (0.25-0.5 mg total) by mouth 2 (two) times daily as needed for anxiety.    Dispense:  15 tablet    Refill:  0    Patient Instructions  Headaches: Certainly sounds like migraines, but the waking up out of sleep is concerning.  Let us get you scheduled for an MRI of the brain for further evaluation.  I'm going to start a medication called sumatriptan/Imitrex to take as needed for migraine pain which would include severe headache, typically on one side of the head, associated with nausea or sensitivity to light/sound.  I am also going to send in a medication called amitriptyline/Elavil to help with sleep as well as headache prevention.  This may also help some with anxiety.  Anxiety: Would strongly consider daily preventive medication, depending on how you are doing if were able to improve sleep.  In the meantime, I have sent a limited prescription for alprazolam/Xanax to take as needed for severe anxiety.   General Preventive Care  Most recent routine screening lipids/other labs: already done!  Everyone should have blood pressure checked once per year. Goal 130/80 or less  Tobacco: don't!   Alcohol: responsible moderation is ok for most adults - if you have concerns about your alcohol intake, please talk to me!   Exercise: as tolerated to reduce risk of cardiovascular disease and diabetes. Strength training will also prevent osteoporosis.   Mental health: if need for mental health care (medicines, counseling, other), or concerns about moods, please let me know!   Sexual health: if need for STD testing, or if concerns with libido/pain problems, please let me know! If you need to discuss your birth control options, please let me know!    Advanced Directive: Living Will and/or Healthcare Power of Attorney recommended for all adults, regardless of age or health.  Vaccines  Flu vaccine: recommended for almost  everyone, every fall.   Shingles vaccine: Shingrix recommended after age 90.   Pneumonia vaccines: Prevnar and Pneumovax recommended after age 66, or sooner if certain medical conditions/smokers   Tetanus booster: Tdap recommended every 10 years. Due 2029.  Cancer screenings   Colon cancer screening: recommended for everyone at age 74-50, but some folks need a colonoscopy sooner if risk factors   Breast cancer screening: mammogram recommended age 17-75.   Cervical cancer screening: Pap every 1 to 5 years depending on age and other risk factors. Can usually stop at age 41 or w/ hysterectomy.   Lung cancer screening: not needed since you've quit!  Infection screenings . HIV: recommended screening at least once age 21-65, more often as needed. . Gonorrhea/Chlamydia: screening as needed. . Hepatitis C: recommended for anyone born 57-1965 . TB: certain at-risk populations, or depending on work requirements and/or travel history Other . Bone Density Test: recommended for women at age 41, sooner depending on risk factors    Please note: Preventive care issues were addressed today as part of your annual wellness physical, and this care should be covered under your insurance. However there were other medical issues which were also addressed today, and insurance may bill you separately for a "problem-based visit" for this care: headaches, insomnia, anxiety. Any questions or concerns about charges which may appear on your billing statement should be directed to your insurance company or to [facility] billing department, and they will contact our office if there are further concerns.          Visit summary with medication list and pertinent instructions was printed for patient to review. All questions at time of  visit were answered - patient instructed to contact office with any additional concerns or updates. ER/RTC precautions were reviewed with the patient.   Note: Total time spent on non-preventive care issues was 25 minutes, greater than 50% of the visit was spent face-to-face counseling and coordinating care for the above diagnoses listed in assessment/plan.   Please note: voice recognition software was used to produce this document, and typos may escape review. Please contact Dr. Lyn Hollingshead for any needed clarifications.     Follow-up plan: Return in about 6 weeks (around 06/08/2019) for RECHECK ON NEW MEDICATION: HEADACHES, INSOMNIA. SOONER IF WORSE/CHANGE, OR PENDING MRI RESULTS .

## 2019-04-27 NOTE — Patient Instructions (Addendum)
Headaches: Certainly sounds like migraines, but the waking up out of sleep is concerning.  Let us get you scheduled for an MRI of the brain for further evaluation.  I'm going to start a medication called sumatriptan/Imitrex to take as needed for migraine pain which would include severe headache, typically on one side of the head, associated with nausea or sensitivity to light/sound.  I am also going to send in a medication called amitriptyline/Elavil to help with sleep as well as headache prevention.  This may also help some with anxiety.  Anxiety: Would strongly consider daily preventive medication, depending on how you are doing if were able to improve sleep.  In the meantime, I have sent a limited prescription for alprazolam/Xanax to take as needed for severe anxiety.   General Preventive Care  Most recent routine screening lipids/other labs: already done!  Everyone should have blood pressure checked once per year. Goal 130/80 or less  Tobacco: don't!   Alcohol: responsible moderation is ok for most adults - if you have concerns about your alcohol intake, please talk to me!   Exercise: as tolerated to reduce risk of cardiovascular disease and diabetes. Strength training will also prevent osteoporosis.   Mental health: if need for mental health care (medicines, counseling, other), or concerns about moods, please let me know!   Sexual health: if need for STD testing, or if concerns with libido/pain problems, please let me know! If you need to discuss your birth control options, please let me know!   Advanced Directive: Living Will and/or Healthcare Power of Attorney recommended for all adults, regardless of age or health.  Vaccines  Flu vaccine: recommended for almost everyone, every fall.   Shingles vaccine: Shingrix recommended after age 61.   Pneumonia vaccines: Prevnar and Pneumovax recommended after age 41, or sooner if certain medical conditions/smokers   Tetanus booster: Tdap  recommended every 10 years. Due 2029.  Cancer screenings   Colon cancer screening: recommended for everyone at age 37-50, but some folks need a colonoscopy sooner if risk factors   Breast cancer screening: mammogram recommended age 59-75.   Cervical cancer screening: Pap every 1 to 5 years depending on age and other risk factors. Can usually stop at age 86 or w/ hysterectomy.   Lung cancer screening: not needed since you've quit!  Infection screenings . HIV: recommended screening at least once age 46-65, more often as needed. . Gonorrhea/Chlamydia: screening as needed. . Hepatitis C: recommended for anyone born 79-1965 . TB: certain at-risk populations, or depending on work requirements and/or travel history Other . Bone Density Test: recommended for women at age 59, sooner depending on risk factors    Please note: Preventive care issues were addressed today as part of your annual wellness physical, and this care should be covered under your insurance. However there were other medical issues which were also addressed today, and insurance may bill you separately for a "problem-based visit" for this care: headaches, insomnia, anxiety. Any questions or concerns about charges which may appear on your billing statement should be directed to your insurance company or to [facility] billing department, and they will contact our office if there are further concerns.

## 2019-05-02 IMAGING — DX DG KNEE COMPLETE 4+V*R*
4 series · 4 of 4 positions shown · non-contrast
Comparison: None.

CLINICAL DATA: Trauma to the right knee in [REDACTED] and Monday April, 2017 with persistent right knee pain.

EXAM:
RIGHT KNEE - COMPLETE 4+ VIEW

[knee ap]
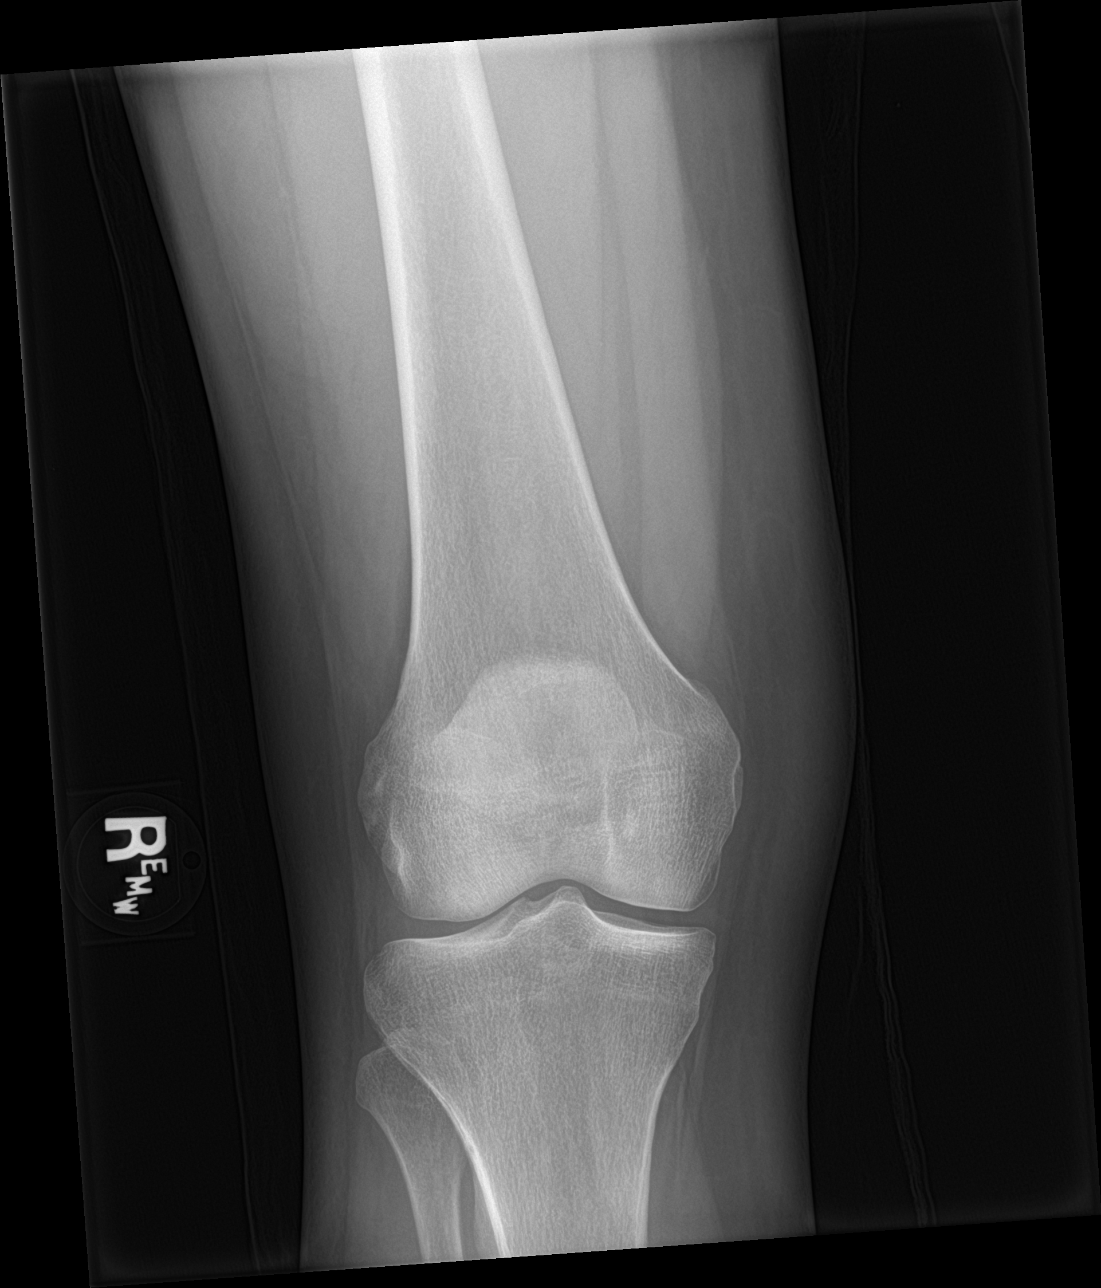

[knee lat]
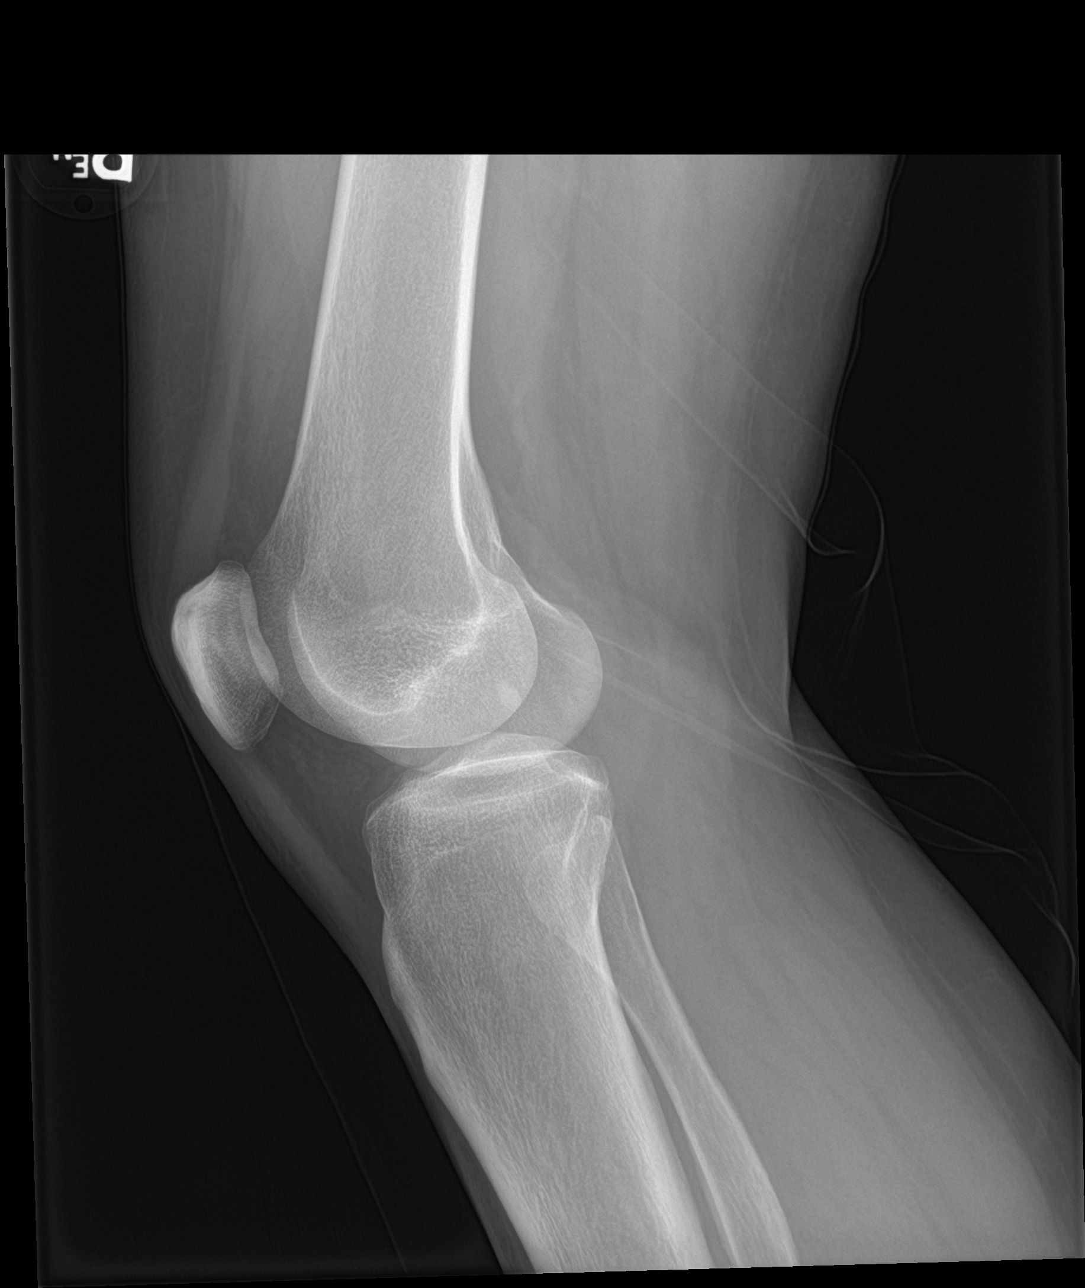

[knee obl (1 of 2)]
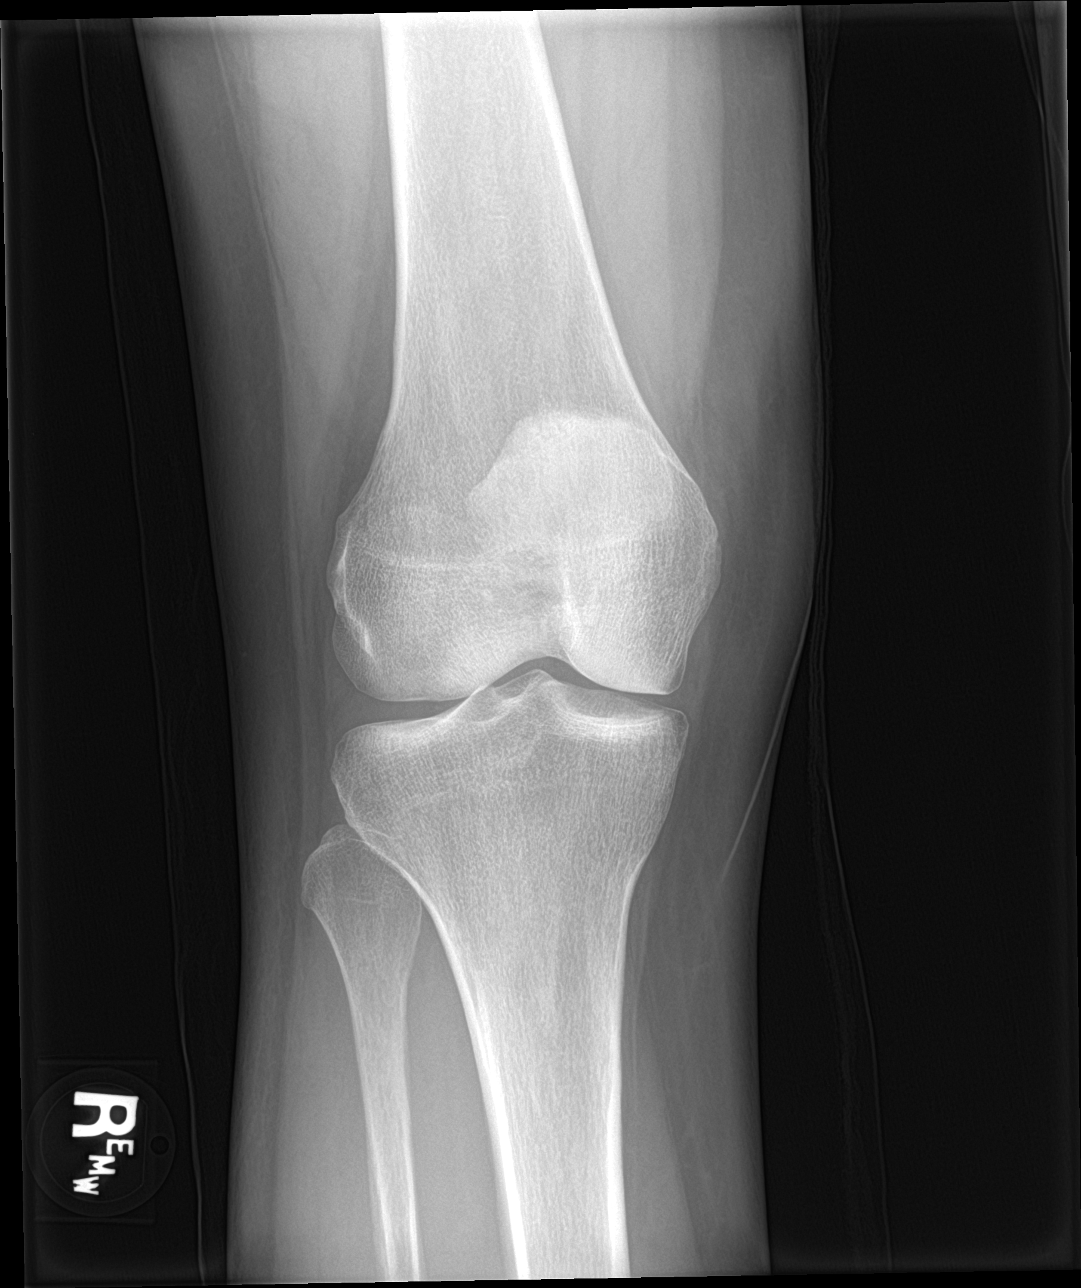

[knee obl (2 of 2)]
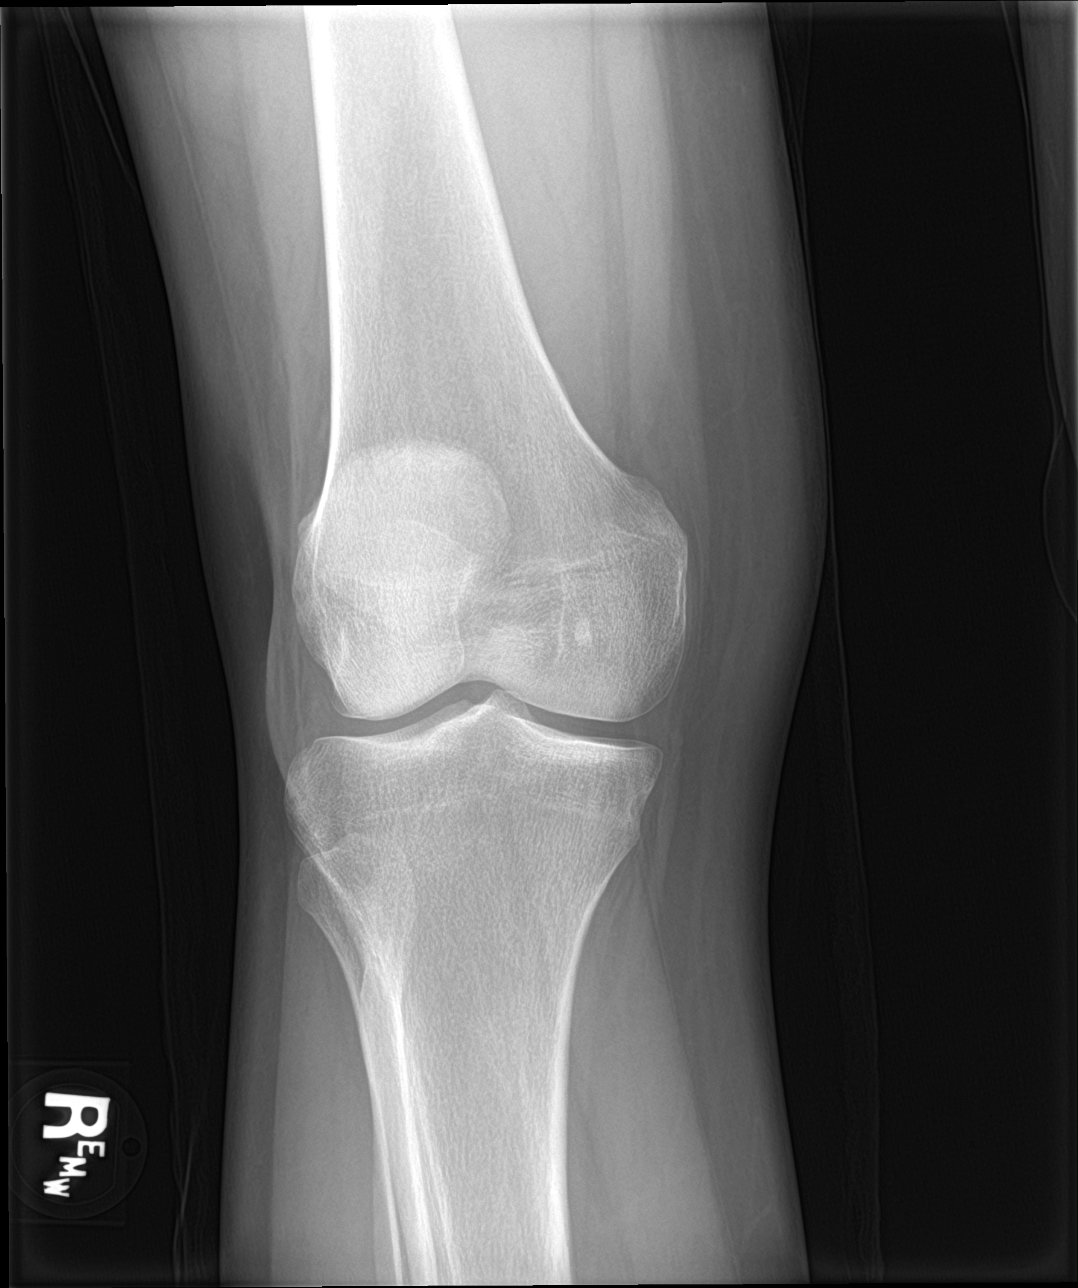

[4 of 4 positions shown; findings below may reference images not displayed]

FINDINGS: No evidence of fracture, dislocation, or joint effusion. No evidence
of arthropathy or other focal bone abnormality. Soft tissues are
unremarkable.
IMPRESSION: Negative.

## 2019-05-03 ENCOUNTER — Other Ambulatory Visit: Payer: Self-pay

## 2019-05-03 ENCOUNTER — Ambulatory Visit (INDEPENDENT_AMBULATORY_CARE_PROVIDER_SITE_OTHER): Payer: 59

## 2019-05-03 DIAGNOSIS — M7751 Other enthesopathy of right foot: Secondary | ICD-10-CM

## 2019-05-03 DIAGNOSIS — G4489 Other headache syndrome: Secondary | ICD-10-CM

## 2019-05-03 MED ORDER — GADOBUTROL 1 MMOL/ML IV SOLN
7.5000 mL | Freq: Once | INTRAVENOUS | Status: AC | PRN
  Administered 2019-05-03: 7.5 mL via INTRAVENOUS

## 2019-05-19 ENCOUNTER — Other Ambulatory Visit: Payer: Self-pay | Admitting: Osteopathic Medicine

## 2019-05-19 ENCOUNTER — Telehealth: Payer: Self-pay

## 2019-05-19 MED ORDER — AMITRIPTYLINE HCL 100 MG PO TABS: 100.0000 mg | ORAL_TABLET | Freq: Every day | ORAL | 3 refills | Status: DC

## 2019-05-19 NOTE — Telephone Encounter (Signed)
Okay, sent prescription for 100 mg tablets, take 1 every evening

## 2019-05-19 NOTE — Telephone Encounter (Signed)
Requested medication (s) are due for refill today: yes  Requested medication (s) are on the active medication list: yes  Last refill:  04/27/2019  Future visit scheduled: no  Notes to clinic:  Patient requesting a 90 day supply Review for refill   Requested Prescriptions  Pending Prescriptions Disp Refills   amitriptyline (ELAVIL) 50 MG tablet [Pharmacy Med Name: AMITRIPTYLINE HCL 50 MG TAB] 159 tablet 1    Sig: TAKE 1 TABLET BY MOUTH AT BEDTIME FOR 7 DAYS, THEN 2 TABLETS AT BEDTIME FOR 23 DAYS.     Psychiatry:  Antidepressants - Heterocyclics (TCAs) Failed - 05/19/2019  9:31 AM      Failed - Completed PHQ-2 or PHQ-9 in the last 360 days.      Passed - Valid encounter within last 6 months    Recent Outpatient Visits          3 weeks ago Annual physical exam   [facility] [place], [place]   9 months ago Post-viral cough syndrome   [place], Elson Areas, PA-C   1 year ago Acute upper respiratory infection   [facility], Elson Areas, PA-C   1 year ago Patellofemoral pain syndrome of right knee   [facility], Rebekah [place], MD   1 year ago Chronic pain of right knee   [facility], Rebekah [place], MD

## 2019-05-19 NOTE — Telephone Encounter (Signed)
Please confirm - Is patient taking the 2 pills qhs (100 mg)?

## 2019-05-19 NOTE — Telephone Encounter (Signed)
Pt returned a call back regarding med refill for amitriptyline. As per pt, currently taking two tabs at bedtime. Pls send updated rx to [facility]. Thanks.

## 2019-05-19 NOTE — Telephone Encounter (Signed)
Patient is aware and did not have any other questions.

## 2019-05-19 NOTE — Telephone Encounter (Signed)
LM on VM to return call with how patient is taking the Elavil. Call back number provided. KG LPN

## 2019-05-28 ENCOUNTER — Encounter: Payer: Self-pay | Admitting: Osteopathic Medicine

## 2019-06-09 ENCOUNTER — Ambulatory Visit: Payer: 59 | Admitting: Osteopathic Medicine

## 2019-06-16 ENCOUNTER — Encounter: Payer: Self-pay | Admitting: Osteopathic Medicine

## 2019-06-16 ENCOUNTER — Ambulatory Visit (INDEPENDENT_AMBULATORY_CARE_PROVIDER_SITE_OTHER): Payer: No Typology Code available for payment source | Admitting: Osteopathic Medicine

## 2019-06-16 ENCOUNTER — Ambulatory Visit: Payer: 59 | Admitting: Osteopathic Medicine

## 2019-06-16 VITALS — Temp 96.6°F | Wt 166.2 lb

## 2019-06-16 DIAGNOSIS — G4709 Other insomnia: Secondary | ICD-10-CM | POA: Diagnosis not present

## 2019-06-16 DIAGNOSIS — F419 Anxiety disorder, unspecified: Secondary | ICD-10-CM

## 2019-06-16 DIAGNOSIS — G43009 Migraine without aura, not intractable, without status migrainosus: Secondary | ICD-10-CM

## 2019-06-16 MED ORDER — BELSOMRA 20 MG PO TABS: 20.0000 mg | ORAL_TABLET | Freq: Every day | ORAL | 0 refills | Status: DC

## 2019-06-16 MED ORDER — BELSOMRA 10 MG PO TABS: 1.0000 | ORAL_TABLET | Freq: Every day | ORAL | 0 refills | Status: DC

## 2019-06-16 MED ORDER — BELSOMRA 15 MG PO TABS: 15.0000 mg | ORAL_TABLET | Freq: Every day | ORAL | 0 refills | Status: DC

## 2019-06-16 NOTE — Progress Notes (Signed)
Virtual Visit via Video (App used: Doximity) Note  I connected with      Natalie Daniels on 06/16/19 at 1:43 PM  by a telemedicine application and verified that I am speaking with the correct person using two identifiers.  Patient is at home I am in office   I discussed the limitations of evaluation and management by telemedicine and the availability of in person appointments. The patient expressed understanding and agreed to proceed.  History of Present Illness: Natalie Daniels is a 48 y.o. female who would like to discuss headaches, insomnia, anxiety    Headaches:   worsening over the past few years, waking her from sleep several nights per week, 3 of migraines, headaches described as typically unilateral usually on the left associated with nausea and sensitivity to light/sound.  Her OB/GYN was prescribing her Fioricet but this does not seem to be very helpful for her.   MRI was ordered and was normal, we started Imitrex and Elavil. Elavil caused some hallucination symptoms in the night, so she tapered off this medicine. Headaches have resolved! Hasn't needed the Imitrex.   Insomnia:   Thinks it might be worse with menopause.  Her OB/GYN was prescribing Ambien but she did not really like taking this medication.  Melatonin seems to be helpful. Falling asleep but not staying asleep.   As above we tried Elavil but this was not tolerated.   Anxiety:  Worsening over the past couple of years.  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  Is reluctant to be on long-term daily medications. We opted to trial alprazolam  has taken this a few times and it helps. Anxiety will cause CP, alprazolam resolves this. She has used it a few times per week and has plenty left.        Observations/Objective: Temp (!) 96.6 F (35.9 C) (Oral)   Wt 166 lb 3.2 oz (75.4 kg)   BMI 25.27 kg/m  BP Readings from Last 3 Encounters:  04/27/19 123/76  08/10/18 (!) 146/88   05/04/18 (!) 143/84   Exam: Normal Speech.  NAD  Lab and Radiology Results No results found for this or any previous visit (from the past 72 hour(s)). No results found.     Assessment and Plan: 48 y.o. female with The primary encounter diagnosis was Other insomnia. Diagnoses of Anxiety and Migraine without aura and without status migrainosus, not intractable were also pertinent to this visit.  Continue current meds Let's try Belsomra for insomnia issues Leave well enough alone re: headaches! Monitor    PDMP not reviewed this encounter. No orders of the defined types were placed in this encounter.  Meds ordered this encounter  Medications  . Suvorexant (BELSOMRA) 10 MG TABS    Sig: Take 1 tablet by mouth at bedtime.    Dispense:  10 tablet    Refill:  0  . Suvorexant (BELSOMRA) 15 MG TABS    Sig: Take 15 mg by mouth at bedtime.    Dispense:  10 tablet    Refill:  0  . Suvorexant (BELSOMRA) 20 MG TABS    Sig: Take 20 mg by mouth at bedtime.    Dispense:  10 tablet    Refill:  0   Patient Instructions  [email or web address] Trial 10 mg then increase to 15 or to 20 if needed Message me if one of these is working and I can send long-term Rx       Instructions sent via [employer]. If  MyChart not available, pt was given option for info via personal e-mail w/ no guarantee of protected health info over unsecured e-mail communication, and MyChart sign-up instructions were sent to patient.   Follow Up Instructions: Return for RECHECK ON NEW MEDICATION FOR SLEEP - IF WORKING CAN REFILL, IF NOT NEEDS VISIT. OTHERWISE ANNUALLY.    I discussed the assessment and treatment plan with the patient. The patient was provided an opportunity to ask questions and all were answered. The patient agreed with the plan and demonstrated an understanding of the instructions.   The patient was advised to call back or seek an in-person evaluation if any new concerns, if  symptoms worsen or if the condition fails to improve as anticipated.  25 minutes of non-face-to-face time was provided during this encounter.      . . . . . . . . . . . . . Marland Kitchen                   Historical information moved to improve visibility of documentation.  Past Medical History:  Diagnosis Date  . Constipation   . Plantar fasciitis   . Rosacea    Past Surgical History:  Procedure Laterality Date  . ANTERIOR FUSION LUMBAR SPINE    . BACK SURGERY    . CESAREAN SECTION    . COLONOSCOPY W/ POLYPECTOMY     age 57   Social History   Tobacco Use  . Smoking status: Former Smoker    Packs/day: 1.00    Years: 8.00    Pack years: 8.00    Types: Cigarettes    Quit date: 06/17/1994    Years since quitting: 25.0  . Smokeless tobacco: Never Used  Substance Use Topics  . Alcohol use: Yes    Comment: <1 month   family history includes Alcohol abuse in her father; Diabetes in her father; Endometriosis in her mother; Heart attack in her father, paternal aunt, and paternal grandfather; Stroke in her maternal grandfather; Thyroid disease in her mother.  Medications: Current Outpatient Medications  Medication Sig Dispense Refill  . ALPRAZolam (XANAX) 0.5 MG tablet Take 0.5-1 tablets (0.25-0.5 mg total) by mouth 2 (two) times daily as needed for anxiety. 15 tablet 0  . Ascorbic Acid (VITAMIN C PO) Take by mouth.    Marland Kitchen BIOTIN PO biotin    . Butalbital-APAP-Caffeine 50-300-40 MG CAPS TAKE ONE CAPSULE EVERY 4 HOURS AS NEEDED FOR HEADACHES    . diclofenac sodium (VOLTAREN) 1 % GEL Apply 4 g topically 4 (four) times daily. To affected joint. 540 g 3  . Docusate Calcium (STOOL SOFTENER PO) Take by mouth.    . Estradiol (YUVAFEM) 10 MCG TABS vaginal tablet Yuvafem 10 mcg vaginal tablet  INSERT 1 TABLET EVERY OTHER DAY BY VAGINAL ROUTE.    . fexofenadine (ALLEGRA) 60 MG tablet Take 60 mg by mouth 2 (two) times daily.    Marland Kitchen MELATONIN PO Take by mouth.    .  metroNIDAZOLE (METROGEL) 1 % gel metronidazole 1 % topical gel    . Multiple Vitamin (MULTIVITAMIN) capsule Take 1 capsule by mouth daily.    . Omega-3 Fatty Acids (FISH OIL PO) Take by mouth.    . Probiotic Product (PROBIOTIC DAILY PO) Take by mouth.    . Sulfacetamide Sodium, Acne, 10 % LOTN 1 APPLICATION TWICE A DAY EXTERNALLY 30 DAYS    . SUMAtriptan (IMITREX) 50 MG tablet Take 0.5-1 tablets (25-50 mg total) by mouth every 2 (two) hours as  needed for migraine. May repeat in 2 hours if headache persists or recurs. 10 tablet 2  . zolpidem (AMBIEN) 5 MG tablet Take 5 mg by mouth at bedtime as needed for sleep.    . Suvorexant (BELSOMRA) 10 MG TABS Take 1 tablet by mouth at bedtime. 10 tablet 0  . Suvorexant (BELSOMRA) 15 MG TABS Take 15 mg by mouth at bedtime. 10 tablet 0  . Suvorexant (BELSOMRA) 20 MG TABS Take 20 mg by mouth at bedtime. 10 tablet 0   No current facility-administered medications for this visit.   Allergies  Allergen Reactions  . Amitriptyline Hcl Other (See Comments)    Hallucinations.  . Aspirin Nausea And Vomiting  . Diclofenac Hives and Itching  . Doxycycline Hives

## 2019-06-16 NOTE — Patient Instructions (Addendum)
[email or web address][employer]it Trial 10 mg then increase to 15 or to 20 if needed Message me if one of these is working and I can send long-term Rx

## 2019-07-07 ENCOUNTER — Encounter: Payer: Self-pay | Admitting: Osteopathic Medicine

## 2019-09-09 ENCOUNTER — Other Ambulatory Visit: Payer: Self-pay | Admitting: Osteopathic Medicine

## 2019-09-09 NOTE — Telephone Encounter (Signed)
Requested  medications are due for refill today yes  Requested medications are on the active medication list Yes  Last refill 3/1  Future visit scheduled no  Notes to clinic Office visit end of December 2020

## 2019-09-28 ENCOUNTER — Encounter: Payer: Self-pay | Admitting: Nurse Practitioner

## 2019-09-28 ENCOUNTER — Ambulatory Visit: Payer: 59

## 2019-09-28 ENCOUNTER — Other Ambulatory Visit: Payer: Self-pay

## 2019-09-28 ENCOUNTER — Ambulatory Visit (INDEPENDENT_AMBULATORY_CARE_PROVIDER_SITE_OTHER): Payer: 59 | Admitting: Nurse Practitioner

## 2019-09-28 VITALS — BP 124/77 | HR 79 | Temp 97.7°F | Ht 66.0 in | Wt 167.0 lb

## 2019-09-28 DIAGNOSIS — R600 Localized edema: Secondary | ICD-10-CM | POA: Diagnosis not present

## 2019-09-28 NOTE — Patient Instructions (Signed)
We will do the ultrasound and then I will be in touch with you about the next steps.    Deep Vein Thrombosis  Deep vein thrombosis (DVT) is a condition in which a blood clot forms in a deep vein, such as a lower leg, thigh, or arm vein. A clot is blood that has thickened into a gel or solid. This condition is dangerous. It can lead to serious and even life-threatening complications if the clot travels to the lungs and causes a blockage (pulmonary embolism). It can also damage veins in the leg. This can result in leg pain, swelling, discoloration, and sores (post-thrombotic syndrome). What are the causes? This condition may be caused by:  A slowdown of blood flow.  Damage to a vein.  A condition that causes blood to clot more easily, such as an inherited clotting disorder. What increases the risk? The following factors may make you more likely to develop this condition:  Being overweight.  Being older, especially over age 7.  Sitting or lying down for more than four hours.  Being in the hospital.  Lack of physical activity (sedentary lifestyle).  Pregnancy, being in childbirth, or having recently given birth.  Taking medicines that contain estrogen, such as medicines to prevent pregnancy.  Smoking.  A history of any of the following: ? Blood clots or a blood clotting disease. ? Peripheral vascular disease. ? Inflammatory bowel disease. ? Cancer. ? Heart disease. ? Genetic conditions that affect how your blood clots, such as Factor V Leiden mutation. ? Neurological diseases that affect your legs (leg paresis). ? A recent injury, such as a car accident. ? Major or lengthy surgery. ? A central line placed inside a large vein. What are the signs or symptoms? Symptoms of this condition include:  Swelling, pain, or tenderness in an arm or leg.  Warmth, redness, or discoloration in an arm or leg. If the clot is in your leg, symptoms may be more noticeable or worse when you  stand or walk. Some people may not develop any symptoms. How is this diagnosed? This condition is diagnosed with:  A medical history and physical exam.  Tests, such as: ? Blood tests. These are done to check how well your blood clots. ? Ultrasound. This is done to check for clots. ? Venogram. For this test, contrast dye is injected into a vein and X-rays are taken to check for any clots. How is this treated? Treatment for this condition depends on:  The cause of your DVT.  Your risk for bleeding or developing more clots.  Any other medical conditions that you have. Treatment may include:  Taking a blood thinner (anticoagulant). This type of medicine prevents clots from forming. It may be taken by mouth, injected under the skin, or injected through an IV (catheter).  Injecting clot-dissolving medicines into the affected vein (catheter-directed thrombolysis).  Having surgery. Surgery may be done to: ? Remove the clot. ? Place a filter in a large vein to catch blood clots before they reach the lungs. Some treatments may be continued for up to six months. Follow these instructions at home: If you are taking blood thinners:  Take the medicine exactly as told by your health care provider. Some blood thinners need to be taken at the same time every day. Do not skip a dose.  Talk with your health care provider before you take any medicines that contain aspirin or NSAIDs. These medicines increase your risk for dangerous bleeding.  Ask your health  care provider about foods and drugs that could change the way the medicine works (may interact). Avoid those things if your health care provider tells you to do so.  Blood thinners can cause easy bruising and may make it difficult to stop bleeding. Because of this: ? Be very careful when using knives, scissors, or other sharp objects. ? Use an electric razor instead of a blade. ? Avoid activities that could cause injury or bruising, and follow  instructions about how to prevent falls.  Wear a medical alert bracelet or carry a card that lists what medicines you take. General instructions  Take over-the-counter and prescription medicines only as told by your health care provider.  Return to your normal activities as told by your health care provider. Ask your health care provider what activities are safe for you.  Wear compression stockings if recommended by your health care provider.  Keep all follow-up visits as told by your health care provider. This is important. How is this prevented? To lower your risk of developing this condition again:  For 30 or more minutes every day, do an activity that: ? Involves moving your arms and legs. ? Increases your heart rate.  When traveling for longer than four hours: ? Exercise your arms and legs every hour. ? Drink plenty of water. ? Avoid drinking alcohol.  Avoid sitting or lying for a long time without moving your legs.  If you have surgery or you are hospitalized, ask about ways to prevent blood clots. These may include taking frequent walks or using anticoagulants.  Stay at a healthy weight.  If you are a woman who is older than age 8, avoid unnecessary use of medicines that contain estrogen, such as some birth control pills.  Do not use any products that contain nicotine or tobacco, such as cigarettes and e-cigarettes. This is especially important if you take estrogen medicines. If you need help quitting, ask your health care provider. Contact a health care provider if:  You miss a dose of your blood thinner.  Your menstrual period is heavier than usual.  You have unusual bruising. Get help right away if:  You have: ? New or increased pain, swelling, or redness in an arm or leg. ? Numbness or tingling in an arm or leg. ? Shortness of breath. ? Chest pain. ? A rapid or irregular heartbeat. ? A severe headache or confusion. ? A cut that will not stop  bleeding.  There is blood in your vomit, stool, or urine.  You have a serious fall or accident, or you hit your head.  You feel light-headed or dizzy.  You cough up blood. These symptoms may represent a serious problem that is an emergency. Do not wait to see if the symptoms will go away. Get medical help right away. Call your local emergency services (911 in the [place] Do not drive yourself to the hospital. Summary  Deep vein thrombosis (DVT) is a condition in which a blood clot forms in a deep vein, such as a lower leg, thigh, or arm vein.  Symptoms can include swelling, warmth, pain, and redness in your leg or arm.  This condition may be treated with a blood thinner (anticoagulant medicine), medicine that is injected to dissolve blood clots,compression stockings, or surgery.  If you are prescribed blood thinners, take them exactly as told. This information is not intended to replace advice given to you by your health care provider. Make sure you discuss any questions you have with  your health care provider. Document Revised: 05/16/2017 Document Reviewed: 11/01/2016 Elsevier Patient Education  [employer].

## 2019-09-28 NOTE — Progress Notes (Signed)
Acute Office Visit  Subjective:    Patient ID: Natalie Daniels, female    DOB: [birth year], 49 y.o.   MRN: [id number]  Chief Complaint  Patient presents with  . Foot Swelling    Onset:2 weeks, left foot, unknown cause, denies injury, no painful, denies diet changes, started using HRT patches on 09/02/2019    HPI Patient is in today for left lower extremity edema.  Patient reports approximately 2 weeks ago while mulching in the yard with her husband she recognized that her left foot was slightly swollen.  Since that time she has tried ice, Advil, propping the foot up without any change to the edema.  She reports the swelling has not gotten any worse however it is not gotten any better.  It is not decreased when she wakes in the mornings.    Denies any soreness, injury, fever, redness, streaking, warmth to the skin on the foot ankle calf or upper leg.  She has never had anything like this happen before.  She has not had any long car rides, plane rides, prolonged length of immobility or sitting.  She does not have any known bites or wounds.  She does report to small red bumps located on the lateral portion of the left ankle however she is unsure if these showed up the same day as the swelling.  She did start on estradiol March 18 and has been utilizing both the patch and vaginal inserts as prescribed by her gynecologist.  Past Medical History:  Diagnosis Date  . Constipation   . Plantar fasciitis   . Rosacea     Past Surgical History:  Procedure Laterality Date  . ANTERIOR FUSION LUMBAR SPINE    . BACK SURGERY    . CESAREAN SECTION    . COLONOSCOPY W/ POLYPECTOMY     age 34    Family History  Problem Relation Age of Onset  . Endometriosis Mother   . Thyroid disease Mother   . Heart attack Father   . Diabetes Father   . Alcohol abuse Father   . Heart attack Paternal Aunt   . Heart attack Paternal Grandfather   . Stroke Maternal Grandfather   . Cancer Neg Hx     Social History    Socioeconomic History  . Marital status: Married    Spouse name: Not on file  . Number of children: Not on file  . Years of education: Not on file  . Highest education level: Not on file  Occupational History  . Not on file  Tobacco Use  . Smoking status: Former Smoker    Packs/day: 1.00    Years: 8.00    Pack years: 8.00    Types: Cigarettes    Quit date: 06/17/1994    Years since quitting: 25.2  . Smokeless tobacco: Never Used  Substance and Sexual Activity  . Alcohol use: Yes    Comment: <1 month  . Drug use: No  . Sexual activity: Yes    Birth control/protection: Pill  Other Topics Concern  . Not on file  Social History Narrative  . Not on file   Social Determinants of Health   Financial Resource Strain:   . Difficulty of Paying Living Expenses:   Food Insecurity:   . Worried About Programme researcher, broadcasting/film/video in the Last Year:   . Barista in the Last Year:   Transportation Needs:   . Freight forwarder (Medical):   Marland Kitchen Lack of Transportation (Non-Medical):  Physical Activity:   . Days of Exercise per Week:   . Minutes of Exercise per Session:   Stress:   . Feeling of Stress :   Social Connections:   . Frequency of Communication with Friends and Family:   . Frequency of Social Gatherings with Friends and Family:   . Attends Religious Services:   . Active Member of Clubs or Organizations:   . Attends Banker Meetings:   Marland Kitchen Marital Status:   Intimate Partner Violence:   . Fear of Current or Ex-Partner:   . Emotionally Abused:   Marland Kitchen Physically Abused:   . Sexually Abused:     Outpatient Medications Prior to Visit  Medication Sig Dispense Refill  . Ascorbic Acid (VITAMIN C PO) Take by mouth.    Marland Kitchen BIOTIN PO biotin    . Butalbital-APAP-Caffeine 50-300-40 MG CAPS TAKE ONE CAPSULE EVERY 4 HOURS AS NEEDED FOR HEADACHES    . Cholecalciferol (VITAMIN D3 PO) Take 3 capsules by mouth daily.    . Cyanocobalamin (VITAMIN B-12 PO) Take 1 tablet by  mouth daily.    . diclofenac sodium (VOLTAREN) 1 % GEL Apply 4 g topically 4 (four) times daily. To affected joint. 540 g 3  . Docusate Calcium (STOOL SOFTENER PO) Take by mouth.    . estradiol (CLIMARA - DOSED IN MG/24 HR) 0.1 mg/24hr patch Place 1 patch onto the skin every 28 (twenty-eight) days.    . Estradiol (YUVAFEM) 10 MCG TABS vaginal tablet Yuvafem 10 mcg vaginal tablet  INSERT 1 TABLET EVERY OTHER DAY BY VAGINAL ROUTE.    . fexofenadine (ALLEGRA) 60 MG tablet Take 60 mg by mouth 2 (two) times daily.    Marland Kitchen KRILL OIL PO Take 1 capsule by mouth daily.    Marland Kitchen MELATONIN PO Take by mouth.    . metroNIDAZOLE (METROGEL) 1 % gel metronidazole 1 % topical gel    . Multiple Vitamin (MULTIVITAMIN) capsule Take 1 capsule by mouth daily.    . Omega-3 Fatty Acids (FISH OIL PO) Take by mouth.    Marland Kitchen POTASSIUM PO Take 1 tablet by mouth daily.    . Probiotic Product (PROBIOTIC DAILY PO) Take by mouth.    . Sulfacetamide Sodium, Acne, 10 % LOTN 1 APPLICATION TWICE A DAY EXTERNALLY 30 DAYS    . SUMAtriptan (IMITREX) 50 MG tablet TAKE 0.5-1 TABLETS BY MOUTH EVERY 2 HOURS AS NEEDED FOR MIGRAINE. MAY REPEAT IN 2 HOURS IF HEADACHE PERSISTS OR RECURS. (MAX 200MG /DAY) 10 tablet 2  . zolpidem (AMBIEN) 5 MG tablet Take 5 mg by mouth at bedtime as needed for sleep.    ALPRAZolam (XANAX) 0.5 MG tablet Take 0.5-1 tablets (0.25-0.5 mg total) by mouth 2 (two) times daily as needed for anxiety. (Patient not taking: Reported on 09/28/2019) 15 tablet 0  . Suvorexant (BELSOMRA) 10 MG TABS Take 1 tablet by mouth at bedtime. (Patient not taking: Reported on 09/28/2019) 10 tablet 0  . Suvorexant (BELSOMRA) 15 MG TABS Take 15 mg by mouth at bedtime. (Patient not taking: Reported on 09/28/2019) 10 tablet 0  . Suvorexant (BELSOMRA) 20 MG TABS Take 20 mg by mouth at bedtime. (Patient not taking: Reported on 09/28/2019) 10 tablet 0   No facility-administered medications prior to visit.    Allergies  Allergen Reactions  .  Amitriptyline Hcl Other (See Comments)    Hallucinations.  . Aspirin Nausea And Vomiting  . Diclofenac Hives and Itching  . Doxycycline Hives    Review of Systems  Constitutional: Negative for activity change, appetite change, chills, fatigue and fever.  Respiratory: Negative for cough, choking, chest tightness and shortness of breath.   Cardiovascular: Positive for leg swelling. Negative for chest pain and palpitations.  Gastrointestinal: Negative for constipation, diarrhea, nausea and vomiting.  Musculoskeletal: Positive for joint swelling. Negative for arthralgias, gait problem and myalgias.  Skin: Negative for color change, pallor, rash and wound.  Neurological: Negative for dizziness, weakness, light-headedness and headaches.  Hematological: Negative for adenopathy. Does not bruise/bleed easily.       Objective:    Physical Exam Constitutional:      Appearance: Normal appearance.  HENT:     Head: Normocephalic.  Eyes:     Extraocular Movements: Extraocular movements intact.     Conjunctiva/sclera: Conjunctivae normal.     Pupils: Pupils are equal, round, and reactive to light.  Cardiovascular:     Rate and Rhythm: Normal rate.     Pulses: Normal pulses.          Dorsalis pedis pulses are 2+ on the right side and 2+ on the left side.       Posterior tibial pulses are 2+ on the right side and 2+ on the left side.  Pulmonary:     Effort: Pulmonary effort is normal.  Abdominal:     General: There is no distension.  Musculoskeletal:        General: Swelling present. No tenderness or signs of injury.     Cervical back: Normal range of motion.     Right lower leg: No edema.     Left lower leg: Edema present.     Right foot: Normal range of motion. No deformity or foot drop.     Left foot: Decreased range of motion. No deformity or foot drop.  Feet:     Right foot:     Skin integrity: Skin integrity normal. No ulcer, skin breakdown, erythema, warmth or fissure.      Toenail Condition: Right toenails are normal.     Left foot:     Skin integrity: Skin integrity normal. No ulcer, skin breakdown, erythema, warmth or fissure.     Toenail Condition: Left toenails are normal.  Skin:    General: Skin is warm and dry.     Capillary Refill: Capillary refill takes less than 2 seconds.     Coloration: Skin is not pale.     Findings: No erythema, lesion or rash.  Neurological:     General: No focal deficit present.     Mental Status: She is alert and oriented to person, place, and time.     Sensory: No sensory deficit.     Motor: No weakness.     Coordination: Coordination normal.     Gait: Gait normal.  Psychiatric:        Mood and Affect: Mood normal.        Behavior: Behavior normal.        Thought Content: Thought content normal.        Judgment: Judgment normal.     BP 124/77   Pulse 79   Temp 97.7 F (36.5 C) (Oral)   Ht 5\' 6"  (1.676 m)   Wt 167 lb (75.8 kg)   LMP 09/03/2019   SpO2 100%   BMI 26.95 kg/m  Wt Readings from Last 3 Encounters:  09/28/19 167 lb (75.8 kg)  06/16/19 166 lb 3.2 oz (75.4 kg)  04/27/19 167 lb 0.6 oz (75.8 kg)    Health Maintenance  Due  Topic Date Due  . HIV Screening  Never done  . PAP SMEAR-Modifier  04/02/2019  . MAMMOGRAM  04/29/2019    There are no preventive care reminders to display for this patient.   No results found for: TSH Lab Results  Component Value Date   WBC 8.1 04/22/2019   HGB 13.5 04/22/2019   HCT 40.6 04/22/2019   MCV 98.3 04/22/2019   PLT 236 04/22/2019   Lab Results  Component Value Date   NA 140 04/22/2019   K 4.1 04/22/2019   CO2 30 04/22/2019   GLUCOSE 92 04/22/2019   BUN 16 04/22/2019   CREATININE 0.78 04/22/2019   BILITOT 0.4 04/22/2019   AST 22 04/22/2019   ALT 27 04/22/2019   PROT 7.0 04/22/2019   CALCIUM 9.1 04/22/2019   Lab Results  Component Value Date   CHOL 225 (H) 04/22/2019   Lab Results  Component Value Date   HDL 74 04/22/2019   Lab Results   Component Value Date   LDLCALC 133 (H) 04/22/2019   Lab Results  Component Value Date   TRIG 84 04/22/2019   Lab Results  Component Value Date   CHOLHDL 3.0 04/22/2019   No results found for: HGBA1C     Assessment & Plan:   1. left lower extremity edema Left lower extremity edema of unknown origin.  Given the patient's recent medication changes to include estradiol there is a strong suspicion for DVT.  There is no history or evidence of injury therefore that is low on the differential diagnosis list. Will obtain a venous ultrasound of the left lower extremity. If results are positive plan to initiate oral anticoagulant and consider follow-up ultrasound for resolution. We will determine follow-up based on imaging results. - US Venous Img Lower Unilateral Left (DVT)   Orma Render, NP

## 2019-09-29 ENCOUNTER — Other Ambulatory Visit: Payer: Self-pay | Admitting: Nurse Practitioner

## 2019-09-29 ENCOUNTER — Encounter: Payer: Self-pay | Admitting: Nurse Practitioner

## 2019-09-29 DIAGNOSIS — Z299 Encounter for prophylactic measures, unspecified: Secondary | ICD-10-CM

## 2019-09-29 MED ORDER — AMOXICILLIN 500 MG PO TABS: 500.0000 mg | ORAL_TABLET | Freq: Three times a day (TID) | ORAL | 0 refills | Status: AC

## 2019-09-29 NOTE — Telephone Encounter (Signed)
Routing to provider  

## 2019-10-19 LAB — HM MAMMOGRAPHY

## 2019-10-22 ENCOUNTER — Encounter: Payer: Self-pay | Admitting: Osteopathic Medicine

## 2019-10-25 ENCOUNTER — Ambulatory Visit (INDEPENDENT_AMBULATORY_CARE_PROVIDER_SITE_OTHER): Payer: 59 | Admitting: Osteopathic Medicine

## 2019-10-25 ENCOUNTER — Other Ambulatory Visit: Payer: Self-pay

## 2019-10-25 ENCOUNTER — Encounter: Payer: Self-pay | Admitting: Osteopathic Medicine

## 2019-10-25 ENCOUNTER — Ambulatory Visit (INDEPENDENT_AMBULATORY_CARE_PROVIDER_SITE_OTHER): Payer: 59

## 2019-10-25 VITALS — BP 120/79 | HR 82 | Temp 98.7°F | Wt 166.1 lb

## 2019-10-25 DIAGNOSIS — M25472 Effusion, left ankle: Secondary | ICD-10-CM | POA: Diagnosis not present

## 2019-10-25 NOTE — Progress Notes (Signed)
Natalie Daniels is a 49 y.o. female who presents to  [place] [facility]  today, 10/25/19, seeking care for the following: . Persistent L foot pain  o Saw NP in this office about a month ago for same. Edema w/o risk factors for DVT and [place] was negative for DVT. Swelling persists, not really painful, no injury      ASSESSMENT & PLAN with other pertinent history/findings:  The encounter diagnosis was Left ankle swelling.  On exam, no pitting edema, minor swelling/puffiness to medial malleolar area. Advised XR, compression/wrpping in AM, ice prn, f/u podiatry prn but no s/s concerning for DVT, severe May-Thurner (though may be mild variant), CHF, renal dz  There are no Patient Instructions on file for this visit.   Orders Placed This Encounter  Procedures  . DG Foot Complete Left  . DG Ankle Complete Left    No orders of the defined types were placed in this encounter.      Follow-up instructions: Return if symptoms worsen or fail to improve please see podiatry!.                                         BP 120/79 (BP Location: Left Arm, Patient Position: Sitting, Cuff Size: Normal)   Pulse 82   Temp 98.7 F (37.1 C) (Oral)   Wt 166 lb 1.3 oz (75.3 kg)   BMI 26.81 kg/m   Current Meds  Medication Sig  . ALPRAZolam (XANAX) 0.5 MG tablet Take 0.5 mg by mouth as needed for anxiety.  . Ascorbic Acid (VITAMIN C PO) Take by mouth.  Marland Kitchen BIOTIN PO biotin  . Cholecalciferol (VITAMIN D3 PO) Take 3 capsules by mouth daily.  . Cyanocobalamin (VITAMIN B-12 PO) Take 1 tablet by mouth daily.  . diclofenac sodium (VOLTAREN) 1 % GEL Apply 4 g topically 4 (four) times daily. To affected joint.  Mariane Baumgarten Calcium (STOOL SOFTENER PO) Take by mouth.  . estradiol (CLIMARA - DOSED IN MG/24 HR) 0.1 mg/24hr patch Place 1 patch onto the skin every 28 (twenty-eight) days.  . Estradiol (YUVAFEM) 10 MCG TABS vaginal  tablet Yuvafem 10 mcg vaginal tablet  INSERT 1 TABLET EVERY OTHER DAY BY VAGINAL ROUTE.  . fexofenadine (ALLEGRA) 60 MG tablet Take 60 mg by mouth 2 (two) times daily.  Marland Kitchen KRILL OIL PO Take 1 capsule by mouth daily.  Marland Kitchen MELATONIN PO Take by mouth.  . metroNIDAZOLE (METROGEL) 1 % gel metronidazole 1 % topical gel  . Multiple Vitamin (MULTIVITAMIN) capsule Take 1 capsule by mouth daily.  . Omega-3 Fatty Acids (FISH OIL PO) Take by mouth.  Marland Kitchen POTASSIUM PO Take 1 tablet by mouth daily.  . Probiotic Product (PROBIOTIC DAILY PO) Take by mouth.  . Sulfacetamide Sodium, Acne, 10 % LOTN 1 APPLICATION TWICE A DAY EXTERNALLY 30 DAYS  . SUMAtriptan (IMITREX) 50 MG tablet TAKE 0.5-1 TABLETS BY MOUTH EVERY 2 HOURS AS NEEDED FOR MIGRAINE. MAY REPEAT IN 2 HOURS IF HEADACHE PERSISTS OR RECURS. (MAX 200MG /DAY)  . zolpidem (AMBIEN) 5 MG tablet Take 5 mg by mouth at bedtime as needed for sleep.    No results found for this or any previous visit (from the past 72 hour(s)).  No results found.  Depression screen [facility] 2/9 10/25/2019 06/16/2019 04/27/2019  Decreased Interest 0 0 0  Down, Depressed, Hopeless 0 0 0  PHQ - 2 Score 0 0 0  Altered sleeping 1 3 1   Tired, decreased energy 1 0 1  Change in appetite 1 0 0  Feeling bad or failure about yourself  1 0 0  Trouble concentrating 0 0 0  Moving slowly or fidgety/restless 0 0 1  Suicidal thoughts 0 0 0  PHQ-9 Score 4 3 3   Difficult doing work/chores Not difficult at all Not difficult at all Not difficult at all    GAD 7 : Generalized Anxiety Score 10/25/2019 06/16/2019 04/27/2019  Nervous, Anxious, on Edge 1 2 1   Control/stop worrying 1 1 1   Worry too much - different things 1 1 1   Trouble relaxing 1 1 1   Restless 0 0 0  Easily annoyed or irritable 1 0 0  Afraid - awful might happen 1 1 1   Total GAD 7 Score 6 6 5   Anxiety Difficulty Not difficult at all Not difficult at all Not difficult at all      All questions at time of visit were answered -  patient instructed to contact office with any additional concerns or updates.  ER/RTC precautions were reviewed with the patient.  Please note: voice recognition software was used to produce this document, and typos may escape review. Please contact Dr. 06/18/2019 for any needed clarifications.

## 2020-01-14 ENCOUNTER — Encounter: Payer: Self-pay | Admitting: Osteopathic Medicine

## 2020-01-20 ENCOUNTER — Other Ambulatory Visit: Payer: Self-pay

## 2020-01-20 ENCOUNTER — Ambulatory Visit: Payer: No Typology Code available for payment source | Admitting: Orthotics

## 2020-01-20 DIAGNOSIS — M778 Other enthesopathies, not elsewhere classified: Secondary | ICD-10-CM

## 2020-01-20 DIAGNOSIS — M779 Enthesopathy, unspecified: Secondary | ICD-10-CM

## 2020-01-20 NOTE — Progress Notes (Signed)
Replace cover under warranty

## 2020-07-19 ENCOUNTER — Encounter: Payer: Self-pay | Admitting: Osteopathic Medicine

## 2020-07-25 ENCOUNTER — Encounter: Payer: No Typology Code available for payment source | Admitting: Sports Medicine

## 2020-07-25 ENCOUNTER — Ambulatory Visit: Payer: No Typology Code available for payment source | Admitting: Osteopathic Medicine

## 2020-08-08 ENCOUNTER — Other Ambulatory Visit: Payer: Self-pay

## 2020-08-08 ENCOUNTER — Ambulatory Visit (INDEPENDENT_AMBULATORY_CARE_PROVIDER_SITE_OTHER): Payer: No Typology Code available for payment source | Admitting: Sports Medicine

## 2020-08-08 ENCOUNTER — Ambulatory Visit (INDEPENDENT_AMBULATORY_CARE_PROVIDER_SITE_OTHER): Payer: No Typology Code available for payment source

## 2020-08-08 DIAGNOSIS — M19041 Primary osteoarthritis, right hand: Secondary | ICD-10-CM

## 2020-08-08 DIAGNOSIS — M79641 Pain in right hand: Secondary | ICD-10-CM

## 2020-08-08 MED ORDER — MELOXICAM 15 MG PO TABS: ORAL_TABLET | ORAL | 3 refills | Status: DC

## 2020-08-08 NOTE — Patient Instructions (Signed)
Osteoarthritis  Osteoarthritis is a type of arthritis. It refers to joint pain or joint disease. Osteoarthritis affects tissue that covers the ends of bones in joints (cartilage). Cartilage acts as a cushion between the bones and helps them move smoothly. Osteoarthritis occurs when cartilage in the joints gets worn down. Osteoarthritis is sometimes called "wear and tear" arthritis. Osteoarthritis is the most common form of arthritis. It often occurs in older people. It is a condition that gets worse over time. The joints most often affected by this condition are in the fingers, toes, hips, knees, and spine, including the neck and lower back. What are the causes? This condition is caused by the wearing down of cartilage that covers the ends of bones. What increases the risk? The following factors may make you more likely to develop this condition:  Being age 50 or older.  Obesity.  Overuse of joints.  Past injury of a joint.  Past surgery on a joint.  Family history of osteoarthritis. What are the signs or symptoms? The main symptoms of this condition are pain, swelling, and stiffness in the joint. Other symptoms may include:  An enlarged joint.  More pain and further damage caused by small pieces of bone or cartilage that break off and float inside of the joint.  Small deposits of bone (osteophytes) that grow on the edges of the joint.  A grating or scraping feeling inside the joint when you move it.  Popping or creaking sounds when you move.  Difficulty walking or exercising.  An inability to grip items, twist your hand(s), or control the movements of your hands and fingers. How is this diagnosed? This condition may be diagnosed based on:  Your medical history.  A physical exam.  Your symptoms.  X-rays of the affected joint(s).  Blood tests to rule out other types of arthritis. How is this treated? There is no cure for this condition, but treatment can help control  pain and improve joint function. Treatment may include a combination of therapies, such as:  Pain relief techniques, such as: ? Applying heat and cold to the joint. ? Massage. ? A form of talk therapy called cognitive behavioral therapy (CBT). This therapy helps you set goals and follow up on the changes that you make.  Medicines for pain and inflammation. The medicines can be taken by mouth or applied to the skin. They include: ? NSAIDs, such as ibuprofen. ? Prescription medicines. ? Strong anti-inflammatory medicines (corticosteroids). ? Certain nutritional supplements.  A prescribed exercise program. You may work with a physical therapist.  Assistive devices, such as a brace, wrap, splint, specialized glove, or cane.  A weight control plan.  Surgery, such as: ? An osteotomy. This is done to reposition the bones and relieve pain or to remove loose pieces of bone and cartilage. ? Joint replacement surgery. You may need this surgery if you have advanced osteoarthritis. Follow these instructions at home: Activity  Rest your affected joints as told by your health care provider.  Exercise as told by your health care provider. He or she may recommend specific types of exercise, such as: ? Strengthening exercises. These are done to strengthen the muscles that support joints affected by arthritis. ? Aerobic activities. These are exercises, such as brisk walking or water aerobics, that increase your heart rate. ? Range-of-motion activities. These help your joints move more easily. ? Balance and agility exercises. Managing pain, stiffness, and swelling  If directed, apply heat to the affected area as often   as told by your health care provider. Use the heat source that your health care provider recommends, such as a moist heat pack or a heating pad. ? If you have a removable assistive device, remove it as told by your health care provider. ? Place a towel between your skin and the heat  source. If your health care provider tells you to keep the assistive device on while you apply heat, place a towel between the assistive device and the heat source. ? Leave the heat on for 20-30 minutes. ? Remove the heat if your skin turns bright red. This is especially important if you are unable to feel pain, heat, or cold. You may have a greater risk of getting burned.  If directed, put ice on the affected area. To do this: ? If you have a removable assistive device, remove it as told by your health care provider. ? Put ice in a plastic bag. ? Place a towel between your skin and the bag. If your health care provider tells you to keep the assistive device on during icing, place a towel between the assistive device and the bag. ? Leave the ice on for 20 minutes, 2-3 times a day. ? Move your fingers or toes often to reduce stiffness and swelling. ? Raise (elevate) the injured area above the level of your heart while you are sitting or lying down.      General instructions  Take over-the-counter and prescription medicines only as told by your health care provider.  Maintain a healthy weight. Follow instructions from your health care provider for weight control.  Do not use any products that contain nicotine or tobacco, such as cigarettes, e-cigarettes, and chewing tobacco. If you need help quitting, ask your health care provider.  Use assistive devices as told by your health care provider.  Keep all follow-up visits as told by your health care provider. This is important. Where to find more information  National Institute of Arthritis and Musculoskeletal and Skin Diseases: www.niams.nih.gov  National Institute on Aging: www.nia.nih.gov  American College of Rheumatology: www.rheumatology.org Contact a health care provider if:  You have redness, swelling, or a feeling of warmth in a joint that gets worse.  You have a fever along with joint or muscle aches.  You develop a  rash.  You have trouble doing your normal activities. Get help right away if:  You have pain that gets worse and is not relieved by pain medicine. Summary  Osteoarthritis is a type of arthritis that affects tissue covering the ends of bones in joints (cartilage).  This condition is caused by the wearing down of cartilage that covers the ends of bones.  The main symptom of this condition is pain, swelling, and stiffness in the joint.  There is no cure for this condition, but treatment can help control pain and improve joint function. This information is not intended to replace advice given to you by your health care provider. Make sure you discuss any questions you have with your health care provider. Document Revised: 05/31/2019 Document Reviewed: 05/31/2019 Elsevier Patient Education  2021 Elsevier Inc.  

## 2020-08-08 NOTE — Assessment & Plan Note (Signed)
This pleasant 50 year old female has had several months of pain and swelling in her right second metacarpal phalangeal joint, visibly swollen on exam today. No trauma, no family history of rheumatoid arthritis. This is likely early osteoarthritis. Adding meloxicam, x-rays, hand physical therapy. Return to see me in 4 weeks, injection if no better.

## 2020-08-08 NOTE — Progress Notes (Signed)
    Procedures performed today:    None.  Independent interpretation of notes and tests performed by another provider:   None.  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 No trauma, no family history of rheumatoid arthritis. This is likely early osteoarthritis. Adding meloxicam, x-rays, hand physical therapy. Return to see me in 4 weeks, injection if no better.    ___________________________________________ Ihor Austin. Benjamin Stain, M.D., ABFM., CAQSM. Primary Care and Sports Medicine [facility]  Adjunct Instructor of Family Medicine  [employer]

## 2020-08-17 ENCOUNTER — Other Ambulatory Visit: Payer: Self-pay

## 2020-08-17 ENCOUNTER — Ambulatory Visit (INDEPENDENT_AMBULATORY_CARE_PROVIDER_SITE_OTHER): Payer: No Typology Code available for payment source | Admitting: Rehabilitative and Restorative Service Providers"

## 2020-08-17 ENCOUNTER — Encounter: Payer: Self-pay | Admitting: Rehabilitative and Restorative Service Providers"

## 2020-08-17 DIAGNOSIS — M79641 Pain in right hand: Secondary | ICD-10-CM

## 2020-08-17 DIAGNOSIS — R29898 Other symptoms and signs involving the musculoskeletal system: Secondary | ICD-10-CM

## 2020-08-17 DIAGNOSIS — M6281 Muscle weakness (generalized): Secondary | ICD-10-CM

## 2020-08-17 NOTE — Therapy (Signed)
[facility] [place] [address] [place], [place], [address] Phone: [phone]   Fax:  [phone]  Physical Therapy Evaluation  Patient Details  Name: Natalie Daniels MRN: [id number] Date of Birth: [birth year] Referring Provider (PT): Dr Benjamin Stain   Encounter Date: 08/17/2020   PT End of Session - 08/17/20 1812    Visit Number 1    Number of Visits 4    Date for PT Re-Evaluation 09/14/20    PT Start Time 1616    PT Stop Time 1704    PT Time Calculation (min) 48 min    Activity Tolerance Patient tolerated treatment well           Past Medical History:  Diagnosis Date  . Anxiety   . Constipation   . Headache   . Plantar fasciitis   . Rosacea     Past Surgical History:  Procedure Laterality Date  . ANTERIOR FUSION LUMBAR SPINE    . BACK SURGERY    . CESAREAN SECTION    . COLONOSCOPY W/ POLYPECTOMY     age 50    There were no vitals filed for this visit.    Subjective Assessment - 08/17/20 1624    Subjective Patient reports Rt hand and index finger pain beginning in June. She had on and off swelling and pain and stiffness in the index finger Rt hand. She was seen by Dr Benjamin Stain last week and started on meds with some improvement.    Pertinent History Rt knee pain 2019 resolved with PT; venous insufficiency with proceedure last month    Patient Stated Goals get finger moving with less pain    Currently in Pain? Yes    Pain Score 5     Pain Location Hand    Pain Orientation Right    Pain Descriptors / Indicators Constant;Sharp;Shooting    Pain Type Chronic pain    Pain Onset More than a month ago    Pain Frequency Constant    Aggravating Factors  typing; eating; cutting; gripping; holding; using Rt hand    Pain Relieving Factors meds              [facility] PT Assessment - 08/17/20 0001      Assessment   Medical Diagnosis Degenerative arthritis Rt index metacarpophalangeal joint    Referring Provider  (PT) Dr Benjamin Stain    Onset Date/Surgical Date 11/30/19    Hand Dominance Right    Next MD Visit 09/06/20    Prior Therapy here for knee 2019      Precautions   Precautions None      Balance Screen   Has the patient fallen in the past 6 months No    Has the patient had a decrease in activity level because of a fear of falling?  No    Is the patient reluctant to leave their home because of a fear of falling?  No      Prior Function   Level of Independence Independent    Vocation Full time employment    Vocation Requirements computer/desk    Leisure household chores; family; dogs      Observation/Other Assessments-Edema    Edema --   moderate edema Rt hand     Sensation   Additional Comments WFL's per pt report      Posture/Postural Control   Posture Comments head forward; shoulders rounded and elevated      AROM   Right/Left Shoulder --   WFL's bilat  Right/Left Elbow --   WFL's bilat   Right/Left Forearm --   WFL's bilat   Right/Left Wrist --   WFL's bilat   Right Composite Finger Extension --   tight end range index finger   Right Composite Finger Flexion --   tight end range index finger; painful end range   Left Composite Finger Extension --   WFL's bilat   Left Composite Finger Flexion --   WFL's bilat     Strength   Right/Left Shoulder --   WFL's bilat   Right/Left Elbow --   WFL's bilat   Right/Left Forearm --   WFL's bilat   Right/Left Wrist --   WFL's bilat   Right Hand Gross Grasp --   pain with resistive testing   Right Hand Grip (lbs) 46    Right Hand Lateral Pinch 10 lbs    Right Hand 3 Point Pinch 9.5 lbs    Left Hand Grip (lbs) 72    Left Hand Lateral Pinch 11 lbs    Left Hand 3 Point Pinch 10 lbs      Palpation   Palpation comment tender to palpation Rt index finger and  interosseous membrane btn index and long fingers                      Objective measurements completed on examination: See above findings.       [facility] Adult  PT Treatment/Exercise - 08/17/20 0001      Hand Exercises   PIPJ Flexion AAROM;Right;5 reps    PIPJ Flexion Limitations Rt index finger; 3-5 sec hold      Wrist Exercises   Wrist Flexion AAROM;Right;5 reps   light composite fist 3-5 sec hold   Wrist Extension AAROM;Right;Left;5 reps   prayer stretch 3-5 sec hold     Manual Therapy   Manual therapy comments Rt hand    Edema Management retrograde massage    Soft tissue mobilization STM Rt hand focus on extensor/flexor digitorum Rt index finger                  PT Education - 08/17/20 1756    Education Details HEP; ergonomic suggestions for modifications for mouse/computer    Person(s) Educated Patient    Methods Explanation;Demonstration;Tactile cues;Verbal cues;Handout    Comprehension Verbalized understanding;Returned demonstration;Verbal cues required;Tactile cues required               PT Long Term Goals - 08/17/20 1818      PT LONG TERM GOAL #1   Title Improve AROM Rt index finger MP flexion and extension to equal Lt    Time 4    Period Weeks    Status New    Target Date 09/14/20      PT LONG TERM GOAL #2   Title Decrease pain with functional activities including griping; pinching; use of mouse/keyboard by 75-80% allowing patient to continue with normal ADL's    Time 4    Period Weeks    Status New    Target Date 09/14/20      PT LONG TERM GOAL #3   Title Decrease pain by 75-80% Rt index finger allowing patient to return to normal functional activities at home and work    Time 4    Period Weeks    Status New    Target Date 09/14/20      PT LONG TERM GOAL #4   Title Independent in HEP    Time  4    Period Weeks    Status New    Target Date 09/14/20      PT LONG TERM GOAL #5   Title Appropriate egronomic recommendations for work    Time 4    Period Weeks    Status New    Target Date 09/14/20                  Plan - 08/17/20 1704    Clinical Impression Statement Patient presents  with ~ 9 month history of Rt index finger pain and edema creating difficulty  with functional activities. ROM is tight at end ranges MP flexion and extension as well as composite flexion and extension Rt hand. She has pain and weakness Rt hand with grasp and pinch compared to Lt. Patient has tightness to palpation alond the extensor digitorum and flexor digitorum of Rt index and much less Rt long finger. Symptoms suggest muscular irritation or Rt index extensor/flexor digitorum likelly from use of mouse/computer at work. Patient will benefit from PT to address problems identified.    Stability/Clinical Decision Making Stable/Uncomplicated    Clinical Decision Making Low    Rehab Potential Good    PT Frequency 1x / week    PT Duration 4 weeks    PT Treatment/Interventions ADLs/Self Care Home Management;Cryotherapy;Electrical Stimulation;Iontophoresis 4mg /ml Dexamethasone;Moist Heat;Ultrasound;Functional mobility training;Therapeutic activities;Therapeutic exercise;Neuromuscular re-education;Patient/family education;Manual techniques;Dry needling;Taping    PT Next Visit Plan review HEP; manual work to Rt hand; continue ergonomic education/problem solving; modalities as indicated    PT Home Exercise Plan [email or web address]    Consulted and Agree with Plan of Care Patient           Patient will benefit from skilled therapeutic intervention in order to improve the following deficits and impairments:  Increased fascial restricitons,Decreased range of motion,Impaired UE functional use,Decreased activity tolerance,Pain,Impaired flexibility,Improper body mechanics,Decreased mobility,Decreased strength,Increased edema  Visit Diagnosis: Hand pain, right  Other symptoms and signs involving the musculoskeletal system  Muscle weakness (generalized)     Problem List Patient Active Problem List   Diagnosis Date Noted  . 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 Vitamin D deficiency 04/27/2019  . Anxiety 04/27/2019  . Other insomnia 04/27/2019  . Migraine without aura and without status migrainosus, not intractable 04/27/2019  . Post-viral cough syndrome 08/10/2018  . Acute costochondritis 08/10/2018  . PND (post-nasal drip) 08/10/2018  . Patellofemoral pain syndrome 07/29/2017  . Chronic pain of right knee 07/03/2017  . Former smoker, stopped smoking many years ago 07/03/2017  . Allergic rhinitis 11/17/2015  . History of motion sickness 11/17/2015    Treg Diemer 01/17/2016 PT, MPH  08/17/2020, 6:24 PM  [facility] [address] [place], [place], [place] Phone: [phone]   Fax:  [phone]  Name: Linnaea Ahn MRN: [name] Date of Birth: Jul 02, 1970

## 2020-08-17 NOTE — Patient Instructions (Signed)
Access Code: [email or web address]: [email or web address]: 03/03/2022Prepared by: Bobbyjo Marulanda HoltExercises  Finger PIP Flexion Extension with Blocking - 3-4 x daily - 7 x weekly - 1 sets - 3-5 reps - 3-5 sec hold  Composite Wrist and Finger Flexion and Extension - 3-4 x daily - 7 x weekly - 1 sets - 3-5 reps - 3-5 sec hold  Wrist Prayer Stretch - 3-4 x daily - 7 x weekly - 1 sets - 3-5 reps - 3-5 sec hold

## 2020-08-23 ENCOUNTER — Other Ambulatory Visit: Payer: Self-pay

## 2020-08-23 ENCOUNTER — Ambulatory Visit (INDEPENDENT_AMBULATORY_CARE_PROVIDER_SITE_OTHER): Payer: No Typology Code available for payment source | Admitting: Physical Therapy

## 2020-08-23 DIAGNOSIS — M79641 Pain in right hand: Secondary | ICD-10-CM

## 2020-08-23 DIAGNOSIS — M6281 Muscle weakness (generalized): Secondary | ICD-10-CM | POA: Diagnosis not present

## 2020-08-23 DIAGNOSIS — R29898 Other symptoms and signs involving the musculoskeletal system: Secondary | ICD-10-CM

## 2020-08-23 NOTE — Therapy (Signed)
[facility] [place] [address] [place], [place], [address] Phone: [phone]   Fax:  [phone]  Physical Therapy Treatment  Patient Details  Name: Natalie Daniels MRN: [id number] Date of Birth: 1971-03-04 Referring Provider (PT): Dr Benjamin Stain   Encounter Date: 08/23/2020   PT End of Session - 08/23/20 1656    Visit Number 2    Number of Visits 4    Date for PT Re-Evaluation 09/14/20    PT Start Time 1517    PT Stop Time 1600    PT Time Calculation (min) 43 min    Activity Tolerance Patient tolerated treatment well    Behavior During Therapy [facility] for tasks assessed/performed           Past Medical History:  Diagnosis Date  . Anxiety   . Constipation   . Headache   . Plantar fasciitis   . Rosacea     Past Surgical History:  Procedure Laterality Date  . ANTERIOR FUSION LUMBAR SPINE    . BACK SURGERY    . CESAREAN SECTION    . COLONOSCOPY W/ POLYPECTOMY     age 50    There were no vitals filed for this visit.   Subjective Assessment - 08/23/20 1519    Subjective Pt reports some improvement in her Rt hand pain. Taking meloxicam 1x/day, voltaren 3x/day, and doing exercises.  She bought new "ergonomic mouse".    Pertinent History Rt knee pain 2019 resolved with PT; venous insufficiency with proceedure last month    Patient Stated Goals get finger moving with less pain    Currently in Pain? Yes    Pain Score 3     Pain Location Hand    Pain Orientation Right    Pain Descriptors / Indicators Dull    Pain Onset More than a month ago    Aggravating Factors  gripping, typing, cutting.    Pain Relieving Factors meds              [facility] PT Assessment - 08/23/20 0001      Assessment   Medical Diagnosis Degenerative arthritis Rt index metacarpophalangeal joint    Referring Provider (PT) Dr Benjamin Stain    Onset Date/Surgical Date 11/30/19    Hand Dominance Right    Next MD Visit 09/06/20    Prior Therapy  here for knee 2019      Strength   Right Hand Grip (lbs) 54    Right Hand 3 Point Pinch 11 lbs    Left Hand Grip (lbs) 63    Left Hand 3 Point Pinch 10.5 lbs              [facility] Adult PT Treatment/Exercise - 08/23/20 0001      Self-Care   Self-Care Other Self-Care Comments;Heat/Ice Application   pt verbalized understanding   Heat/Ice Application instructed in ice massage application including rationale, technique, and parameters    Other Self-Care Comments  educated pt on workstation set up with neutral wrist with mouse.      Hand Exercises   MCPJ Flexion AAROM;Right;5 reps;PROM    PIPJ Flexion AAROM;Right;5 reps    PIPJ Flexion Limitations Rt index finger; 3-5 sec hold    DIPJ Flexion AAROM;Right;5 reps    Digit Composite ADduction AROM;Right;10 reps   2nd and third phalanx     Wrist Exercises   Wrist Flexion AAROM;Right;5 reps   light composite fist 3-5 sec hold with pt in supine with arm abdct and wrist off  of table,   Other wrist exercises wrist flexion/ ext (R) stretch x 20 sec x 3 reps;  trial of Rt hand stretch at MCP (not tolerated); Prayer stretch x 30 sec x 2      Manual Therapy   Manual Therapy Joint mobilization;Soft tissue mobilization;Passive ROM;Taping    Joint Mobilization gentle mobilization (AP) between metacarpals of Rt hand.    Soft tissue mobilization STM and IASTM to Rt hand (palmar/dorsal surface), space between metacarpals, gently over flexor tendons, and thenar/hypothenar emminance, and Rt wrist extensors- to decrease restrictions and improve mobility.    Passive ROM Rt 2nd phalange/MCP into flexion (to tissue limits and no pain)    Kinesiotex Edema      Kinesiotix   Edema I strip of reg colored rock tape with button hold for index finger, placed on dorsum of hand with 10% stretch; to assist with decompression of tissue and reduction of edema.                  PT Education - 08/23/20 1714    Education Details wrist flexion and ext stretches.     Person(s) Educated Patient    Methods Explanation;Demonstration;Verbal cues   printer not working.   Comprehension Verbalized understanding;Returned demonstration               PT Long Term Goals - 08/17/20 1818      PT LONG TERM GOAL #1   Title Improve AROM Rt index finger MP flexion and extension to equal Lt    Time 4    Period Weeks    Status New    Target Date 09/14/20      PT LONG TERM GOAL #2   Title Decrease pain with functional activities including griping; pinching; use of mouse/keyboard by 75-80% allowing patient to continue with normal ADL's    Time 4    Period Weeks    Status New    Target Date 09/14/20      PT LONG TERM GOAL #3   Title Decrease pain by 75-80% Rt index finger allowing patient to return to normal functional activities at home and work    Time 4    Period Weeks    Status New    Target Date 09/14/20      PT LONG TERM GOAL #4   Title Independent in HEP    Time 4    Period Weeks    Status New    Target Date 09/14/20      PT LONG TERM GOAL #5   Title Appropriate egronomic recommendations for work    Time 4    Period Weeks    Status New    Target Date 09/14/20                 Plan - 08/23/20 1657    Clinical Impression Statement Overall reduction in pain in Rt hand/ first finger and improvement in grip strength.  Point tender in Rt proximal wrist flexors, palmar surface of MCP joints and space between 2nd and 3rd metacarpals.  Continued tightness with composite flexion of index finger.  Trial of ktape to dorsal surface of Rt hand over 2nd and 3rd metacarpals where there is visible swelling. Goals are ongoing.    Stability/Clinical Decision Making Stable/Uncomplicated    Rehab Potential Good    PT Frequency 1x / week    PT Duration 4 weeks    PT Treatment/Interventions ADLs/Self Care Home Management;Cryotherapy;Electrical Stimulation;Iontophoresis 4mg /ml Dexamethasone;Moist Heat;Ultrasound;Functional mobility training;Therapeutic  activities;Therapeutic exercise;Neuromuscular re-education;Patient/family education;Manual techniques;Dry needling;Taping    PT Next Visit Plan review HEP; manual work to Rt hand; continue ergonomic education/problem solving; modalities as indicated    PT Home Exercise Plan [email or web address]    Consulted and Agree with Plan of Care Patient           Patient will benefit from skilled therapeutic intervention in order to improve the following deficits and impairments:  Increased fascial restricitons,Decreased range of motion,Impaired UE functional use,Decreased activity tolerance,Pain,Impaired flexibility,Improper body mechanics,Decreased mobility,Decreased strength,Increased edema  Visit Diagnosis: Hand pain, right  Other symptoms and signs involving the musculoskeletal system  Muscle weakness (generalized)     Problem List Patient Active Problem List   Diagnosis Date Noted  . 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 Vitamin D deficiency 04/27/2019  . Anxiety 04/27/2019  . Other insomnia 04/27/2019  . Migraine without aura and without status migrainosus, not intractable 04/27/2019  . Post-viral cough syndrome 08/10/2018  . Acute costochondritis 08/10/2018  . PND (post-nasal drip) 08/10/2018  . Patellofemoral pain syndrome 07/29/2017  . Chronic pain of right knee 07/03/2017  . Former smoker, stopped smoking many years ago 07/03/2017  . Allergic rhinitis 11/17/2015  . History of motion sickness 11/17/2015   Mayer Camel, PTA 08/23/20 5:15 PM  [facility] [place] [address] [place], [place], [address] Phone: [phone]   Fax:  [phone]  Name: Natalie Daniels MRN: [id number] Date of Birth: [birth year]

## 2020-08-23 NOTE — Patient Instructions (Signed)
Composite Flexion (Active Extensor Stretch)   WRIST: Extensors    Hold arm out in front of body. Elbow straight, palm down. Use opposite hand to bend wrist down until gentle stretch is felt on top of arm. Hold _30__ seconds. __2-3_ reps per set, _2-3__ sets per day, __7_ days per week   Copyright  VHI. All rights reserved.

## 2020-08-24 ENCOUNTER — Encounter: Payer: No Typology Code available for payment source | Admitting: Physical Therapy

## 2020-08-31 ENCOUNTER — Encounter: Payer: Self-pay | Admitting: Rehabilitative and Restorative Service Providers"

## 2020-08-31 ENCOUNTER — Ambulatory Visit (INDEPENDENT_AMBULATORY_CARE_PROVIDER_SITE_OTHER): Payer: No Typology Code available for payment source | Admitting: Rehabilitative and Restorative Service Providers"

## 2020-08-31 ENCOUNTER — Other Ambulatory Visit: Payer: Self-pay

## 2020-08-31 DIAGNOSIS — M79641 Pain in right hand: Secondary | ICD-10-CM

## 2020-08-31 DIAGNOSIS — R29898 Other symptoms and signs involving the musculoskeletal system: Secondary | ICD-10-CM

## 2020-08-31 DIAGNOSIS — M6281 Muscle weakness (generalized): Secondary | ICD-10-CM

## 2020-08-31 NOTE — Patient Instructions (Signed)

## 2020-08-31 NOTE — Therapy (Addendum)
[place], [place], [address] Phone: [phone]   Fax:  [phone]  Physical Therapy Treatment  Patient Details  Name: Natalie Daniels MRN: [id number] Date of Birth: [birth year] Referring Provider (PT): Dr Dianah Field   Encounter Date: 08/31/2020   PT End of Session - 08/31/20 1626    Visit Number 3    Number of Visits 4    Date for PT Re-Evaluation 09/14/20    PT Start Time 1622    PT Stop Time 9798    PT Time Calculation (Daniels) 36 Daniels    Activity Tolerance Patient tolerated treatment well           Past Medical History:  Diagnosis Date  . Anxiety   . Constipation   . Headache   . Plantar fasciitis   . Rosacea     Past Surgical History:  Procedure Laterality Date  . ANTERIOR FUSION LUMBAR SPINE    . BACK SURGERY    . CESAREAN SECTION    . COLONOSCOPY W/ POLYPECTOMY     age 50    There were no vitals filed for this visit.   Subjective Assessment - 08/31/20 1627    Subjective Continued pain in the the Rt hand top and bottom and index finger. Tape and massage did not help. Has ergonomic mouse and has adjusted to the new mouse. It has less clicking. Work still irritates symptoms with desk/computer; writing; gripping    Currently in Pain? Yes    Pain Score 5     Pain Location Hand    Pain Orientation Right    Pain Descriptors / Indicators Dull;Aching    Pain Type Chronic pain    Pain Onset More than a month ago    Pain Frequency Constant    Aggravating Factors  gripping; typing; cutting; computer mouse              [facility] PT Assessment - 08/31/20 0001      Assessment   Medical Diagnosis Degenerative arthritis Rt index metacarpophalangeal joint    Referring Provider (PT) Dr Dianah Field    Onset Date/Surgical Date 11/30/19    Hand Dominance Right    Next MD Visit 09/06/20    Prior Therapy here for knee 2019      AROM   Right Composite Finger Extension --   tight end  range index finger   Right Composite Finger Flexion --   tight index finger painful at end range     Strength   Right Hand Grip (lbs) 40   painful   Right Hand 3 Point Pinch 11 lbs    Left Hand Grip (lbs) 58    Left Hand 3 Point Pinch 10.5 lbs      Palpation   Palpation comment tender to palpation Rt index finger and  interosseous membrane btn index and long fingers                         [facility] Adult PT Treatment/Exercise - 08/31/20 0001      Exercises   Exercises --   reviewed exercises for home     Hand Exercises   MCPJ Flexion AAROM;Right;5 reps;PROM    PIPJ Flexion AAROM;Right;5 reps    PIPJ Flexion Limitations Rt index finger; 3-5 sec hold    DIPJ Flexion AAROM;Right;5 reps    Digit Composite ADduction AROM;Right;10 reps   2nd and third phalanx     Wrist Exercises  Wrist Flexion AAROM;Right;5 reps   light composite fist 3-5 sec hold   Other wrist exercises wrist flexion/ ext (R) stretch x 20 sec x 3 reps;  trial of Rt hand stretch at MCP (not tolerated); Prayer stretch x 30 sec x 2      Iontophoresis   Type of Iontophoresis Dexamethasone    Location dorsal hand/MP index finger    Dose 80 mAp/Daniels 1 cc    Time 8 hours      Manual Therapy   Joint Mobilization gentle mobilization (AP) between metacarpals of Rt hand.                  PT Education - 08/31/20 1639    Education Details ionto    Person(s) Educated Patient    Methods Explanation;Handout    Comprehension Verbalized understanding               PT Long Term Goals - 08/17/20 1818      PT LONG TERM GOAL #1   Title Improve AROM Rt index finger MP flexion and extension to equal Lt    Time 4    Period Weeks    Status New    Target Date 09/14/20      PT LONG TERM GOAL #2   Title Decrease pain with functional activities including griping; pinching; use of mouse/keyboard by 75-80% allowing patient to continue with normal ADL's    Time 4    Period Weeks    Status New     Target Date 09/14/20      PT LONG TERM GOAL #3   Title Decrease pain by 75-80% Rt index finger allowing patient to return to normal functional activities at home and work    Time 4    Period Weeks    Status New    Target Date 09/14/20      PT LONG TERM GOAL #4   Title Independent in HEP    Time 4    Period Weeks    Status New    Target Date 09/14/20      PT LONG TERM GOAL #5   Title Appropriate egronomic recommendations for work    Time 4    Period Weeks    Status New    Target Date 09/14/20                 Plan - 08/31/20 1753    Clinical Impression Statement Patient reports that symptoms have continued and she does not feel there has been any improvement. Patient has continued edema dorsum Rt hand which is slightly decreased. She now has mild edema in the index MP area of the volar hand. Patient has limited MP flexion of the index finger; pain with AROM and strengthening. She has purchased an ergonomic mouse for use at home but has not noticed improvement in the hand/finger pain. Pain is increased with typing; use of the mouse; writing; gripping and functional activities. Trial of ionto to the dorsum of Rt hand at index MP joint. Patient will rest the hand for the next three days while at a women's retreat. Patient will return to MD next week and advise on continued treatment. It seems the persistent symptoms are related to overuse of Rt and - especially the index finger.    Rehab Potential Good    PT Frequency 1x / week    PT Duration 4 weeks    PT Treatment/Interventions ADLs/Self Care Home Management;Cryotherapy;Electrical Stimulation;Iontophoresis 53m/ml Dexamethasone;Moist Heat;Ultrasound;Functional mobility  training;Therapeutic activities;Therapeutic exercise;Neuromuscular re-education;Patient/family education;Manual techniques;Dry needling;Taping    PT Next Visit Plan assess ionto; await MD recommendations    PT Home Exercise Plan [id number]    Consulted and Agree with  Plan of Care Patient           Patient will benefit from skilled therapeutic intervention in order to improve the following deficits and impairments:     Visit Diagnosis: Hand pain, right  Other symptoms and signs involving the musculoskeletal system  Muscle weakness (generalized)     Problem List Patient Active Problem List   Diagnosis Date Noted  . 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 Vitamin D deficiency 04/27/2019  . Anxiety 04/27/2019  . Other insomnia 04/27/2019  . Migraine without aura and without status migrainosus, not intractable 04/27/2019  . Post-viral cough syndrome 08/10/2018  . Acute costochondritis 08/10/2018  . PND (post-nasal drip) 08/10/2018  . Patellofemoral pain syndrome 07/29/2017  . Chronic pain of right knee 07/03/2017  . Former smoker, stopped smoking many years ago 07/03/2017  . Allergic rhinitis 11/17/2015  . History of motion sickness 11/17/2015    Narada Uzzle Nilda Simmer PT, MPH  08/31/2020, 6:02 PM  [facility] [place], [place], [address] Phone: [phone]   Fax:  [phone]  Name: Natalie Daniels MRN: [id number] Date of Birth: Nov 10, 1970  PHYSICAL THERAPY DISCHARGE SUMMARY  Visits from Start of Care: 3  Current functional level related to goals / functional outcomes: See progress report for discharge status from PT   Remaining deficits: Unchanged    Education / Equipment: HEP   Plan: Patient agrees to discharge.  Patient goals were partially met. Patient is being discharged due to                                                     ?????     Returned to Dr T for injection with good resolution of symptoms.   Mihir Flanigan P. Helene Kelp PT, MPH 10/31/20 8:55 AM

## 2020-09-05 ENCOUNTER — Other Ambulatory Visit: Payer: Self-pay

## 2020-09-05 ENCOUNTER — Ambulatory Visit (INDEPENDENT_AMBULATORY_CARE_PROVIDER_SITE_OTHER): Payer: No Typology Code available for payment source | Admitting: Sports Medicine

## 2020-09-05 ENCOUNTER — Ambulatory Visit (INDEPENDENT_AMBULATORY_CARE_PROVIDER_SITE_OTHER): Payer: No Typology Code available for payment source

## 2020-09-05 DIAGNOSIS — M19041 Primary osteoarthritis, right hand: Secondary | ICD-10-CM

## 2020-09-05 NOTE — Progress Notes (Signed)
    Procedures performed today:    Procedure: Real-time Ultrasound Guided injection of the right second MCP Device: Samsung HS60  Verbal informed consent obtained.  Time-out conducted.  Noted no overlying erythema, induration, or other signs of local infection.  Skin prepped in a sterile fashion.  Local anesthesia: Topical Ethyl chloride.  With sterile technique and under real time ultrasound guidance:  Noted moderate synovitis, 1/2 cc lidocaine, 1/2 cc kenalog 40 injected easily.   Completed without difficulty  Advised to call if fevers/chills, erythema, induration, drainage, or persistent bleeding.  Images permanently stored and available for review in PACS.  Impression: Technically successful ultrasound guided injection.  Independent interpretation of notes and tests performed by another provider:   None.  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 Today we injected her right second MCP. Return to see me in a month.    ___________________________________________ Ihor Austin. Benjamin Stain, M.D., ABFM., CAQSM. Primary Care and Sports Medicine [facility]  Adjunct Instructor of Family Medicine  [employer]

## 2020-09-05 NOTE — Assessment & Plan Note (Signed)
Natalie Daniels returns, unfortunately she has not improved after several months of pain and swelling of the right second metacarpal phalangeal joint, it was visibly swollen today, no family history of rheumatoid arthritis, no trauma, we did some physical therapy, meloxicam, nothing is working. Today we injected her right second MCP. Return to see me in a month.

## 2020-09-07 ENCOUNTER — Encounter: Payer: No Typology Code available for payment source | Admitting: Rehabilitative and Restorative Service Providers"

## 2020-10-03 ENCOUNTER — Telehealth: Payer: No Typology Code available for payment source | Admitting: Sports Medicine

## 2020-12-02 ENCOUNTER — Other Ambulatory Visit: Payer: Self-pay | Admitting: Sports Medicine

## 2020-12-02 DIAGNOSIS — M19041 Primary osteoarthritis, right hand: Secondary | ICD-10-CM

## 2021-01-02 LAB — HM PAP SMEAR: HM Pap smear: NEGATIVE

## 2021-02-21 ENCOUNTER — Other Ambulatory Visit: Payer: Self-pay

## 2021-02-21 ENCOUNTER — Ambulatory Visit (INDEPENDENT_AMBULATORY_CARE_PROVIDER_SITE_OTHER): Payer: No Typology Code available for payment source | Admitting: Podiatry

## 2021-02-21 DIAGNOSIS — M778 Other enthesopathies, not elsewhere classified: Secondary | ICD-10-CM

## 2021-02-21 DIAGNOSIS — B351 Tinea unguium: Secondary | ICD-10-CM | POA: Diagnosis not present

## 2021-02-22 NOTE — Progress Notes (Signed)
Subjective:   Patient ID: Natalie Daniels, female   DOB: 50 y.o.   MRN: [id number]   HPI Patient states she needs new orthotics that she is having pain in both of her feet and orthotics have been effective   ROS      Objective:  Physical Exam  Neurovascular status intact with patient having no other structural abnormalities with orthotics that have worn out as far as cushioning with moderate changes in the actual structure of the orthotic but pretty good at this time     Assessment:  Chronic capsulitis like inflammation with orthotics which is done a good job of controlling with moderate discomfort     Plan:  H&P recommended new orthotics and then getting these recovered.  Patient is to have the same type make as they are doing very well for her and then we will get those dispensed and have the old ones rehabilitated

## 2021-03-14 ENCOUNTER — Telehealth: Payer: Self-pay | Admitting: Podiatry

## 2021-03-14 NOTE — Telephone Encounter (Signed)
Orthotics in.. pt aware ok to pick up.. 

## 2021-03-20 DIAGNOSIS — M778 Other enthesopathies, not elsewhere classified: Secondary | ICD-10-CM

## 2021-03-30 ENCOUNTER — Ambulatory Visit (INDEPENDENT_AMBULATORY_CARE_PROVIDER_SITE_OTHER): Payer: No Typology Code available for payment source | Admitting: Sports Medicine

## 2021-03-30 ENCOUNTER — Ambulatory Visit (INDEPENDENT_AMBULATORY_CARE_PROVIDER_SITE_OTHER): Payer: No Typology Code available for payment source

## 2021-03-30 DIAGNOSIS — M19041 Primary osteoarthritis, right hand: Secondary | ICD-10-CM | POA: Diagnosis not present

## 2021-03-30 MED ORDER — DICLOFENAC SODIUM 2 % EX SOLN: 2.0000 | Freq: Two times a day (BID) | CUTANEOUS | 11 refills | Status: DC

## 2021-03-30 NOTE — Assessment & Plan Note (Addendum)
Annina returns, she is a pleasant 50 year old female, known right second MCP osteoarthritis, last injected in March, recurrence of pain, repeat injection today. Meloxicam ineffective, Voltaren provide some relief, I do think we need to bump this up to Pennsaid (diclofenac 2% topical). Sending off to [employer], home conditioning given. Return as needed.

## 2021-03-30 NOTE — Progress Notes (Signed)
    Procedures performed today:    Procedure: Real-time Ultrasound Guided injection of the right second MCP Device: Samsung HS60  Verbal informed consent obtained.  Time-out conducted.  Noted no overlying erythema, induration, or other signs of local infection.  Skin prepped in a sterile fashion.  Local anesthesia: Topical Ethyl chloride.  With sterile technique and under real time ultrasound guidance:  Noted moderate synovitis, 1/2 cc lidocaine, 1/2 cc kenalog 40 injected easily.   Completed without difficulty  Advised to call if fevers/chills, erythema, induration, drainage, or persistent bleeding.  Images permanently stored and available for review in PACS.  Impression: Technically successful ultrasound guided injection.  Independent interpretation of notes and tests performed by another provider:   None.  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 Meloxicam ineffective, Voltaren provide some relief, I do think we need to bump this up to Pennsaid (diclofenac 2% topical). Sending off to [employer], home conditioning given. Return as needed.    ___________________________________________ Ihor Austin. Benjamin Stain, M.D., ABFM., CAQSM. Primary Care and Sports Medicine [facility]  Adjunct Instructor of Family Medicine  [employer]

## 2021-04-03 ENCOUNTER — Telehealth: Payer: Self-pay

## 2021-04-03 NOTE — Telephone Encounter (Signed)
Pharmacy advised  

## 2021-04-03 NOTE — Telephone Encounter (Signed)
[facility] pharmacist called and states since the patient has an allergy of hives and itching with Diclofenac they do not want to fill the prescription. Please advise.

## 2021-04-03 NOTE — Telephone Encounter (Signed)
It is okay, the medication is topical and Voltaren topical has not caused untoward side effects.  Tell them to go ahead and fill it.

## 2021-04-04 ENCOUNTER — Telehealth: Payer: Self-pay

## 2021-04-04 DIAGNOSIS — M19041 Primary osteoarthritis, right hand: Secondary | ICD-10-CM

## 2021-04-04 MED ORDER — DICLOFENAC SODIUM 3 % EX GEL: CUTANEOUS | 11 refills | Status: DC

## 2021-04-04 NOTE — Telephone Encounter (Signed)
Trying a different formulation sent to her local pharmacy, if this still does not work with her insurance then we will just have to use the over-the-counter 1% Voltaren gel.

## 2021-04-04 NOTE — Telephone Encounter (Signed)
Patient aware of Dr. Karie Schwalbe recommendations and is agreeable to the plan.

## 2021-04-04 NOTE — Telephone Encounter (Signed)
Patient called to report that the pharmacy we sent the prescription to is not contracted with her insurance. How can she get the meds?

## 2021-04-11 ENCOUNTER — Telehealth: Payer: Self-pay | Admitting: Podiatry

## 2021-04-11 NOTE — Telephone Encounter (Signed)
Refurbished orthotics in... lvm for pt ok to pick up. 

## 2021-04-13 ENCOUNTER — Telehealth: Payer: Self-pay

## 2021-04-13 NOTE — Telephone Encounter (Signed)
Medication: Diclofenac Sodium 3 % GEL Prior authorization submitted via CoverMyMeds on 04/13/2021 [place] submission pending

## 2021-04-16 ENCOUNTER — Telehealth: Payer: Self-pay | Admitting: Podiatry

## 2021-04-16 NOTE — Telephone Encounter (Signed)
Pt left message stating the orthotic she received recently are lifting on the left heel area and she has some that were refurbished waiting to be picked up..  I left message for pt that when she comes in to pick up the refurbished ones if she would switch them out with the ones that the left heel is lifting(ripping) and I will get them sent back and redone.Marland KitchenMarland Kitchen

## 2021-04-20 ENCOUNTER — Telehealth: Payer: Self-pay | Admitting: Podiatry

## 2021-04-20 NOTE — Telephone Encounter (Signed)
Medication: Diclofenac Sodium 3 % GEL Prior authorization determination received Medication has been denied Reason for denial:  "Your plan covers this drug when you have actinic keratoses ([place])"

## 2021-04-20 NOTE — Telephone Encounter (Signed)
Pt dropped off the left orthotic that she got in sept 2022 the heel is peeling up. I am sending back to be recovered.

## 2021-05-02 ENCOUNTER — Telehealth: Payer: Self-pay | Admitting: Podiatry

## 2021-05-02 NOTE — Telephone Encounter (Signed)
Left orthotic in.. pt aware ok to pick up.

## 2021-05-04 LAB — HM COLONOSCOPY

## 2021-06-15 ENCOUNTER — Ambulatory Visit (INDEPENDENT_AMBULATORY_CARE_PROVIDER_SITE_OTHER): Payer: No Typology Code available for payment source | Admitting: Family Medicine

## 2021-06-15 ENCOUNTER — Other Ambulatory Visit: Payer: Self-pay

## 2021-06-15 ENCOUNTER — Encounter: Payer: Self-pay | Admitting: Family Medicine

## 2021-06-15 VITALS — BP 134/82 | HR 79 | Wt 166.0 lb

## 2021-06-15 DIAGNOSIS — J40 Bronchitis, not specified as acute or chronic: Secondary | ICD-10-CM

## 2021-06-15 DIAGNOSIS — J329 Chronic sinusitis, unspecified: Secondary | ICD-10-CM

## 2021-06-15 MED ORDER — ALBUTEROL SULFATE HFA 108 (90 BASE) MCG/ACT IN AERS: 2.0000 | INHALATION_SPRAY | Freq: Four times a day (QID) | RESPIRATORY_TRACT | 11 refills | Status: DC | PRN

## 2021-06-15 MED ORDER — BENZONATATE 200 MG PO CAPS
200.0000 mg | ORAL_CAPSULE | Freq: Two times a day (BID) | ORAL | 0 refills | Status: DC | PRN
Start: 2021-06-15 — End: 2022-01-09

## 2021-06-15 MED ORDER — AMOXICILLIN-POT CLAVULANATE 875-125 MG PO TABS
1.0000 | ORAL_TABLET | Freq: Two times a day (BID) | ORAL | 0 refills | Status: AC
Start: 2021-06-15 — End: 2021-06-25

## 2021-06-15 NOTE — Progress Notes (Signed)
Acute Office Visit  Subjective:    Patient ID: Natalie Daniels, female    DOB: [birth year], 50 y.o.   MRN: [id number]  Chief Complaint  Patient presents with   Cough    Cough  Patient is in today for cough.   Patient states she has been struggling with a cough for the past 3-4 weeks and it is now starting to feel "deeper in the chest." Cough can be productive at times with dark yellow sputum. It does keep her up at night. She is also having some nasal congestion, but denies fever, chills, sore throat, headache, sinus pressure, dyspnea, wheezing, ear pain. She has tested negative for COVID.    Past Medical History:  Diagnosis Date   Anxiety    Constipation    Headache    Plantar fasciitis    Rosacea     Past Surgical History:  Procedure Laterality Date   ANTERIOR FUSION LUMBAR SPINE     BACK SURGERY     CESAREAN SECTION     COLONOSCOPY W/ POLYPECTOMY     age 85    Family History  Problem Relation Age of Onset   Endometriosis Mother    Thyroid disease Mother    Heart attack Father    Diabetes Father    Alcohol abuse Father    Heart attack Paternal Aunt    Heart attack Paternal Grandfather    Stroke Maternal Grandfather    Cancer Neg Hx     Social History   Socioeconomic History   Marital status: Married    Spouse name: Not on file   Number of children: Not on file   Years of education: Not on file   Highest education level: Not on file  Occupational History   Not on file  Tobacco Use   Smoking status: Former    Packs/day: 1.00    Years: 8.00    Pack years: 8.00    Types: Cigarettes    Quit date: 06/17/1994    Years since quitting: 27.0   Smokeless tobacco: Never  Vaping Use   Vaping Use: Never used  Substance and Sexual Activity   Alcohol use: Yes    Comment: <1 month   Drug use: No   Sexual activity: Yes    Birth control/protection: Pill  Other Topics Concern   Not on file  Social History Narrative   Not on file   Social Determinants of  Health   Financial Resource Strain: Not on file  Food Insecurity: Not on file  Transportation Needs: Not on file  Physical Activity: Not on file  Stress: Not on file  Social Connections: Not on file  Intimate Partner Violence: Not on file    Outpatient Medications Prior to Visit  Medication Sig Dispense Refill   ALPRAZolam (XANAX) 0.5 MG tablet Take 0.5 mg by mouth as needed for anxiety.     Ascorbic Acid (VITAMIN C PO) Take by mouth.     BIOTIN PO biotin     Cholecalciferol (VITAMIN D3 PO) Take 3 capsules by mouth daily.     Cyanocobalamin (VITAMIN B-12 PO) Take 1 tablet by mouth daily.     Diclofenac Sodium 3 % GEL Apply once or twice daily to affected areas 100 g 11   fexofenadine (ALLEGRA) 60 MG tablet Take 60 mg by mouth 2 (two) times daily.     MELATONIN PO Take by mouth.     meloxicam (MOBIC) 15 MG tablet TAKE 1 TABLET BY MOUTH IN THE  MORNING WITH A MEAL FOR 2 WEEKS, THEN TAKE 1 TABLET EVERY DAY AS NEEDED FOR PAIN 30 tablet 3   metroNIDAZOLE (METROGEL) 1 % gel metronidazole 1 % topical gel     Multiple Vitamin (MULTIVITAMIN) capsule Take 1 capsule by mouth daily.     Omega-3 Fatty Acids (FISH OIL PO) Take by mouth.     POTASSIUM PO Take 1 tablet by mouth daily.     Probiotic Product (PROBIOTIC DAILY PO) Take by mouth.     Sulfacetamide Sodium, Acne, 10 % LOTN 1 APPLICATION TWICE A DAY EXTERNALLY 30 DAYS     TRULANCE 3 MG TABS Take 1 tablet by mouth daily.     Docusate Calcium (STOOL SOFTENER PO) Take by mouth.     KRILL OIL PO Take 1 capsule by mouth daily.     No facility-administered medications prior to visit.    Allergies  Allergen Reactions   Amitriptyline Hcl Other (See Comments)    Hallucinations.   Aspirin Nausea And Vomiting   Diclofenac Hives and Itching   Doxycycline Hives   Ivermectin Other (See Comments)    Review of Systems  Respiratory:  Positive for cough.   All review of systems negative except what is listed in the HPI     Objective:     Physical Exam Vitals reviewed.  Constitutional:      Appearance: Normal appearance.  HENT:     Head: Normocephalic and atraumatic.     Right Ear: Tympanic membrane normal.     Left Ear: Tympanic membrane normal.     Nose: Congestion present.     Mouth/Throat:     Mouth: Mucous membranes are moist.     Pharynx: Oropharynx is clear.  Eyes:     Extraocular Movements: Extraocular movements intact.     Conjunctiva/sclera: Conjunctivae normal.  Cardiovascular:     Rate and Rhythm: Normal rate and regular rhythm.  Pulmonary:     Effort: Pulmonary effort is normal.     Breath sounds: Normal breath sounds. No wheezing, rhonchi or rales.  Musculoskeletal:     Cervical back: Normal range of motion and neck supple.  Skin:    General: Skin is warm and dry.  Neurological:     Mental Status: She is alert and oriented to person, place, and time.  Psychiatric:        Mood and Affect: Mood normal.        Behavior: Behavior normal.        Thought Content: Thought content normal.        Judgment: Judgment normal.       BP 134/82    Pulse 79    Wt 166 lb (75.3 kg)    SpO2 100%    BMI 26.79 kg/m  Wt Readings from Last 3 Encounters:  06/15/21 166 lb (75.3 kg)  10/25/19 166 lb 1.3 oz (75.3 kg)  09/28/19 167 lb (75.8 kg)    Health Maintenance Due  Topic Date Due   HIV Screening  Never done   Hepatitis C Screening  Never done   COLONOSCOPY (Pts 45-29yrs Insurance coverage will need to be confirmed)  Never done   PAP SMEAR-Modifier  04/02/2019   Zoster Vaccines- Shingrix (1 of 2) Never done    There are no preventive care reminders to display for this patient.   No results found for: TSH Lab Results  Component Value Date   WBC 8.1 04/22/2019   HGB 13.5 04/22/2019   HCT 40.6 04/22/2019  MCV 98.3 04/22/2019   PLT 236 04/22/2019   Lab Results  Component Value Date   NA 140 04/22/2019   K 4.1 04/22/2019   CO2 30 04/22/2019   GLUCOSE 92 04/22/2019   BUN 16 04/22/2019    CREATININE 0.78 04/22/2019   BILITOT 0.4 04/22/2019   AST 22 04/22/2019   ALT 27 04/22/2019   PROT 7.0 04/22/2019   CALCIUM 9.1 04/22/2019   Lab Results  Component Value Date   CHOL 225 (H) 04/22/2019   Lab Results  Component Value Date   HDL 74 04/22/2019   Lab Results  Component Value Date   LDLCALC 133 (H) 04/22/2019   Lab Results  Component Value Date   TRIG 84 04/22/2019   Lab Results  Component Value Date   CHOLHDL 3.0 04/22/2019   No results found for: HGBA1C     Assessment & Plan:   1. Sinobronchitis Lungs clear today.  Given duration, treating with Augmentin. Adding benzonatate and albuterol for cough and bronchospasm. Continue supportive measures including rest, hydration, humidifier use, steam showers, warm compresses to sinuses, warm liquids with lemon and honey, and over-the-counter cough, cold, and analgesics as needed.  Patient aware of signs/symptoms requiring further/urgent evaluation.   - amoxicillin-clavulanate (AUGMENTIN) 875-125 MG tablet; Take 1 tablet by mouth 2 (two) times daily for 10 days.  Dispense: 20 tablet; Refill: 0 - benzonatate (TESSALON) 200 MG capsule; Take 1 capsule (200 mg total) by mouth 2 (two) times daily as needed for cough.  Dispense: 20 capsule; Refill: 0 - albuterol (VENTOLIN HFA) 108 (90 Base) MCG/ACT inhaler; Inhale 2 puffs into the lungs every 6 (six) hours as needed for wheezing.  Dispense: 2 each; Refill: 11    Follow-up if symptoms worsen or fail to improve.   Lollie Marrow Reola Calkins, DNP, FNP-C

## 2021-07-23 ENCOUNTER — Ambulatory Visit (INDEPENDENT_AMBULATORY_CARE_PROVIDER_SITE_OTHER): Payer: No Typology Code available for payment source | Admitting: Sports Medicine

## 2021-07-23 ENCOUNTER — Other Ambulatory Visit: Payer: Self-pay

## 2021-07-23 ENCOUNTER — Ambulatory Visit (INDEPENDENT_AMBULATORY_CARE_PROVIDER_SITE_OTHER): Payer: No Typology Code available for payment source

## 2021-07-23 DIAGNOSIS — M1812 Unilateral primary osteoarthritis of first carpometacarpal joint, left hand: Secondary | ICD-10-CM

## 2021-07-23 MED ORDER — IBUPROFEN 200 MG PO TABS: 800.0000 mg | ORAL_TABLET | Freq: Three times a day (TID) | ORAL | Status: DC | PRN

## 2021-07-23 NOTE — Progress Notes (Signed)
° ° °  Procedures performed today:    None.  Independent interpretation of notes and tests performed by another provider:   None.  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 Adding some x-rays, she will get a thumb loop, prescription strength ibuprofen, home conditioning. Return to see me in 4 to 6 weeks, ultrasound-guided for [facility] injection if not better.    ___________________________________________ Gwen Her. Dianah Field, M.D., ABFM., CAQSM. Primary Care and [place] Instructor of [employer] [facility] [employer]

## 2021-07-23 NOTE — Patient Instructions (Signed)
Look into getting a thumb loop on [employer]

## 2021-07-23 NOTE — Assessment & Plan Note (Signed)
Natalie Daniels is a pleasant 51 year old female, for the past 2 weeks she has had increasing discomfort left first [facility] with radiation of the wrist, negative Lourena Simmonds sign today, she does have pain with stressing of the carpometacarpal joint. Adding some x-rays, she will get a thumb loop, prescription strength ibuprofen, home conditioning. Return to see me in 4 to 6 weeks, ultrasound-guided for [facility] injection if not better.

## 2021-08-20 ENCOUNTER — Ambulatory Visit: Payer: No Typology Code available for payment source | Admitting: Sports Medicine

## 2021-08-22 ENCOUNTER — Encounter: Payer: No Typology Code available for payment source | Admitting: Sports Medicine

## 2022-01-07 LAB — HM MAMMOGRAPHY

## 2022-01-09 ENCOUNTER — Ambulatory Visit (INDEPENDENT_AMBULATORY_CARE_PROVIDER_SITE_OTHER): Payer: No Typology Code available for payment source | Admitting: Medical-Surgical

## 2022-01-09 ENCOUNTER — Encounter: Payer: Self-pay | Admitting: Medical-Surgical

## 2022-01-09 VITALS — BP 140/82 | HR 80 | Resp 20 | Ht 68.0 in | Wt 162.6 lb

## 2022-01-09 DIAGNOSIS — Z0000 Encounter for general adult medical examination without abnormal findings: Secondary | ICD-10-CM

## 2022-01-09 DIAGNOSIS — E559 Vitamin D deficiency, unspecified: Secondary | ICD-10-CM

## 2022-01-09 DIAGNOSIS — Z7689 Persons encountering health services in other specified circumstances: Secondary | ICD-10-CM

## 2022-01-09 DIAGNOSIS — G43009 Migraine without aura, not intractable, without status migrainosus: Secondary | ICD-10-CM

## 2022-01-09 DIAGNOSIS — Z833 Family history of diabetes mellitus: Secondary | ICD-10-CM

## 2022-01-09 DIAGNOSIS — G4709 Other insomnia: Secondary | ICD-10-CM

## 2022-01-09 DIAGNOSIS — F419 Anxiety disorder, unspecified: Secondary | ICD-10-CM | POA: Diagnosis not present

## 2022-01-09 DIAGNOSIS — Z23 Encounter for immunization: Secondary | ICD-10-CM

## 2022-01-09 DIAGNOSIS — Z1231 Encounter for screening mammogram for malignant neoplasm of breast: Secondary | ICD-10-CM

## 2022-01-09 DIAGNOSIS — Z124 Encounter for screening for malignant neoplasm of cervix: Secondary | ICD-10-CM | POA: Diagnosis not present

## 2022-01-09 DIAGNOSIS — Z1329 Encounter for screening for other suspected endocrine disorder: Secondary | ICD-10-CM

## 2022-01-09 DIAGNOSIS — Z131 Encounter for screening for diabetes mellitus: Secondary | ICD-10-CM

## 2022-01-09 DIAGNOSIS — Z8349 Family history of other endocrine, nutritional and metabolic diseases: Secondary | ICD-10-CM

## 2022-01-09 MED ORDER — ESCITALOPRAM OXALATE 10 MG PO TABS: ORAL_TABLET | ORAL | 0 refills | Status: DC

## 2022-01-09 NOTE — Progress Notes (Signed)
Complete physical exam  Patient: Natalie Daniels   DOB: 10/23/1970   51 y.o. Female  MRN: [id number]  Subjective:    No chief complaint on file. Transfer of care  Azhar Yogi is a 51 y.o. female who presents today for a complete physical exam. She reports consuming a general diet. Exercise is limited by injury at the beginning of the year. She generally feels fairly well. She reports sleeping fairly well. She does not have additional problems to discuss today.    Most recent fall risk assessment:    10/25/2019   10:25 AM  Fall Risk   Falls in the past year? 0  Injury with Fall? 0     Most recent depression screenings:    10/25/2019   10:25 AM 06/16/2019    1:27 PM  PHQ 2/9 Scores  PHQ - 2 Score 0 0  PHQ- 9 Score 4 3    Vision:Within last year, Dental: No current dental problems and Receives regular dental care, and STD: The patient denies history of sexually transmitted disease.    Patient Care Team: Sunnie Nielsen, DO as PCP - General (Family Medicine) Sharyn Lull, Elvin So, MD as Referring Physician (Dermatology) Annamaria Helling, MD as Consulting Physician (Obstetrics and Gynecology)   Outpatient Medications Prior to Visit  Medication Sig   Ascorbic Acid (VITAMIN C PO) Take by mouth.   BIOTIN PO biotin   Cholecalciferol (VITAMIN D3 PO) Take 3 capsules by mouth daily.   Cyanocobalamin (VITAMIN B-12 PO) Take 1 tablet by mouth daily.   Diclofenac Sodium 3 % GEL Apply once or twice daily to affected areas   fexofenadine (ALLEGRA) 60 MG tablet Take 60 mg by mouth 2 (two) times daily.   metroNIDAZOLE (METROGEL) 1 % gel metronidazole 1 % topical gel   Multiple Vitamin (MULTIVITAMIN) capsule Take 1 capsule by mouth daily.   Omega-3 Fatty Acids (FISH OIL PO) Take by mouth.   POTASSIUM PO Take 1 tablet by mouth daily.   Probiotic Product (PROBIOTIC DAILY PO) Take by mouth.   Sulfacetamide Sodium, Acne, 10 % LOTN 1 APPLICATION TWICE A DAY EXTERNALLY 30 DAYS    [DISCONTINUED] ALPRAZolam (XANAX) 0.5 MG tablet Take 0.5 mg by mouth as needed for anxiety.   [DISCONTINUED] benzonatate (TESSALON) 200 MG capsule Take 1 capsule (200 mg total) by mouth 2 (two) times daily as needed for cough.   [DISCONTINUED] ibuprofen (ADVIL) 200 MG tablet Take 4 tablets (800 mg total) by mouth every 8 (eight) hours as needed.   [DISCONTINUED] TRULANCE 3 MG TABS Take 1 tablet by mouth daily.   No facility-administered medications prior to visit.    Review of Systems  Constitutional:  Negative for chills, fever and malaise/fatigue.  Respiratory:  Negative for cough, shortness of breath and wheezing.   Cardiovascular:  Negative for chest pain, palpitations and leg swelling.  Neurological:  Negative for dizziness and headaches.  Psychiatric/Behavioral:  Negative for depression and suicidal ideas. The patient is nervous/anxious. The patient does not have insomnia.      Objective:    There were no vitals taken for this visit.   Physical Exam   No results found for any visits on 01/09/22.     Assessment & Plan:    Routine Health Maintenance and Physical Exam  Immunization History  Administered Date(s) Administered   Influenza Inj Mdck Quad Pf 02/17/2018   Influenza,inj,Quad PF,6+ Mos 04/26/2016, 03/05/2017, 03/25/2019   Influenza-Unspecified 03/17/2017, 03/30/2020   Td 05/08/2007   Tdap 07/03/2017  Health Maintenance  Topic Date Due   COVID-19 Vaccine (1) Never done   HIV Screening  Never done   Hepatitis C Screening  Never done   Zoster Vaccines- Shingrix (1 of 2) Never done   COLONOSCOPY (Pts 45-5yrs Insurance coverage will need to be confirmed)  Never done   PAP SMEAR-Modifier  04/02/2019   MAMMOGRAM  10/18/2021   INFLUENZA VACCINE  01/15/2022   TETANUS/TDAP  07/04/2027   HPV VACCINES  Aged Out    Discussed health benefits of physical activity, and encouraged her to engage in regular exercise appropriate for her age and condition.  Problem List  Items Addressed This Visit       Cardiovascular and Mediastinum   Migraine without aura and without status migrainosus, not intractable     Other   Anxiety   Other insomnia   Vitamin D deficiency   Other Visit Diagnoses     Encounter to establish care    -  Primary   Annual physical exam       Cervical cancer screening       Encounter for screening mammogram for malignant neoplasm of breast       Diabetes mellitus screening       Thyroid disorder screen          No follow-ups on file.     Christen Butter, NP

## 2022-01-09 NOTE — Progress Notes (Signed)
Complete physical exam  Patient: Natalie Daniels   DOB: [birth year]   51 y.o. Female  MRN: [id number]  Subjective:    Chief Complaint  Patient presents with   Transitions Of Care   Transfer of care  Natalie Daniels is a 51 y.o. female who presents today for a complete physical exam. She reports consuming a general diet. Exercise is limited by injury at the beginning of the year. She generally feels well. She reports sleeping well. She does have additional problems to discuss today.   Anxiety-notes that she has had some issues with anxiety throughout life but she has previously been able to manage them with lifestyle modifications.  Unfortunately, lately she has gotten to a point where she is not able to manage her anxiety.  She finds her supporting all the time and getting irritable quickly.  Notes that her family has noticed and feels that it is time to start some kind of medication to help with this.  She previously tried Xanax but even a half tab made her feel like a zombie and she would prefer to avoid this.  Notes that her symptoms are present every day for most of the day.  Has not tried other medications before but is open to options.  Most recent fall risk assessment:    10/25/2019   10:25 AM  Fall Risk   Falls in the past year? 0  Injury with Fall? 0     Most recent depression screenings:    10/25/2019   10:25 AM 06/16/2019    1:27 PM  PHQ 2/9 Scores  PHQ - 2 Score 0 0  PHQ- 9 Score 4 3    Vision:Within last year, Dental: No current dental problems and Receives regular dental care, and STD: The patient denies history of sexually transmitted disease.    Patient Care Team: Christen Butter, NP as PCP - General (Nurse Practitioner) Jacqlyn Krauss, MD as Referring Physician (Dermatology) Annamaria Helling, MD as Consulting Physician (Obstetrics and Gynecology)   Outpatient Medications Prior to Visit  Medication Sig   Ascorbic Acid (VITAMIN C PO) Take by mouth.   BIOTIN  PO biotin   Cholecalciferol (VITAMIN D3 PO) Take 3 capsules by mouth daily.   Cyanocobalamin (VITAMIN B-12 PO) Take 1 tablet by mouth daily.   Diclofenac Sodium 3 % GEL Apply once or twice daily to affected areas   fexofenadine (ALLEGRA) 60 MG tablet Take 60 mg by mouth 2 (two) times daily.   metroNIDAZOLE (METROGEL) 1 % gel metronidazole 1 % topical gel   Multiple Vitamin (MULTIVITAMIN) capsule Take 1 capsule by mouth daily.   Omega-3 Fatty Acids (FISH OIL PO) Take by mouth.   POTASSIUM PO Take 1 tablet by mouth daily.   Probiotic Product (PROBIOTIC DAILY PO) Take by mouth.   Sulfacetamide Sodium, Acne, 10 % LOTN 1 APPLICATION TWICE A DAY EXTERNALLY 30 DAYS   [DISCONTINUED] ALPRAZolam (XANAX) 0.5 MG tablet Take 0.5 mg by mouth as needed for anxiety.   [DISCONTINUED] benzonatate (TESSALON) 200 MG capsule Take 1 capsule (200 mg total) by mouth 2 (two) times daily as needed for cough.   [DISCONTINUED] ibuprofen (ADVIL) 200 MG tablet Take 4 tablets (800 mg total) by mouth every 8 (eight) hours as needed.   [DISCONTINUED] TRULANCE 3 MG TABS Take 1 tablet by mouth daily.   No facility-administered medications prior to visit.    Review of Systems  Constitutional:  Negative for chills, fever, malaise/fatigue and weight loss.  HENT:  Negative for congestion, ear pain, hearing loss, sinus pain and sore throat.   Eyes:  Negative for blurred vision, photophobia and pain.  Respiratory:  Negative for cough, shortness of breath and wheezing.   Cardiovascular:  Negative for chest pain, palpitations and leg swelling.  Gastrointestinal:  Negative for abdominal pain, constipation, diarrhea, heartburn, nausea and vomiting.  Genitourinary:  Negative for dysuria, frequency and urgency.  Musculoskeletal:  Negative for falls and neck pain.  Skin:  Negative for itching and rash.  Neurological:  Negative for dizziness, weakness and headaches.  Endo/Heme/Allergies:  Negative for polydipsia. Does not bruise/bleed  easily.  Psychiatric/Behavioral:  Negative for depression, substance abuse and suicidal ideas. The patient is nervous/anxious. The patient does not have insomnia.      Objective:    BP 140/82 (BP Location: Right Arm, Cuff Size: Normal)   Pulse 80   Resp 20   Ht 5\' 6"  (1.676 m)   Wt 162 lb 9.6 oz (73.8 kg)   SpO2 100%   BMI 26.24 kg/m    Physical Exam Constitutional:      General: She is not in acute distress.    Appearance: Normal appearance. She is not ill-appearing.  HENT:     Head: Normocephalic and atraumatic.     Right Ear: Tympanic membrane, ear canal and external ear normal. There is no impacted cerumen.     Left Ear: Tympanic membrane, ear canal and external ear normal. There is no impacted cerumen.     Nose: Nose normal.     Mouth/Throat:     Mouth: Mucous membranes are moist.     Pharynx: No oropharyngeal exudate or posterior oropharyngeal erythema.  Eyes:     General: No scleral icterus.       Right eye: No discharge.        Left eye: No discharge.     Extraocular Movements: Extraocular movements intact.     Conjunctiva/sclera: Conjunctivae normal.     Pupils: Pupils are equal, round, and reactive to light.  Neck:     Thyroid: No thyromegaly.     Vascular: No carotid bruit or JVD.     Trachea: Trachea normal.  Cardiovascular:     Rate and Rhythm: Normal rate and regular rhythm.     Pulses: Normal pulses.     Heart sounds: Normal heart sounds. No murmur heard.    No friction rub. No gallop.  Pulmonary:     Effort: Pulmonary effort is normal. No respiratory distress.     Breath sounds: Normal breath sounds. No wheezing.  Abdominal:     General: Bowel sounds are normal. There is no distension.     Palpations: Abdomen is soft.     Tenderness: There is no abdominal tenderness. There is no guarding.  Musculoskeletal:        General: Normal range of motion.     Cervical back: Normal range of motion and neck supple.  Skin:    General: Skin is warm and dry.   Neurological:     Mental Status: She is alert and oriented to person, place, and time.     Cranial Nerves: No cranial nerve deficit.  Psychiatric:        Mood and Affect: Mood normal.        Behavior: Behavior normal.        Thought Content: Thought content normal.        Judgment: Judgment normal.   No results found for any visits on 01/09/22.  Assessment & Plan:    Routine Health Maintenance and Physical Exam  Immunization History  Administered Date(s) Administered   Influenza Inj Mdck Quad Pf 02/17/2018   Influenza,inj,Quad PF,6+ Mos 04/26/2016, 03/05/2017, 03/25/2019   Influenza-Unspecified 03/17/2017, 03/30/2020   Td 05/08/2007   Tdap 07/03/2017    Health Maintenance  Topic Date Due   COVID-19 Vaccine (1) Never done   HIV Screening  Never done   Hepatitis C Screening  Never done   COLONOSCOPY (Pts 45-31yrs Insurance coverage will need to be confirmed)  Never done   PAP SMEAR-Modifier  04/02/2019   Zoster Vaccines- Shingrix (1 of 2) Never done   MAMMOGRAM  10/18/2021   INFLUENZA VACCINE  01/15/2022   TETANUS/TDAP  07/04/2027   HPV VACCINES  Aged Out    Discussed health benefits of physical activity, and encouraged her to engage in regular exercise appropriate for her age and condition.  1. Encounter to establish care Reviewed available information and discussed care concerns with patient.   2. Annual physical exam Checking labs as below.  Up-to-date on preventative care.  Wellness information provided with AVS. - Lipid panel - COMPLETE METABOLIC PANEL WITH GFR - CBC with Differential/Platelet  3. Cervical cancer screening Up-to-date.  Completed by OB/GYN.  4. Migraine without aura and without status migrainosus, not intractable No current concerns.  5. Anxiety Discussed various options.  We will start with Lexapro 5 mg daily for 1 week then increase to 10 mg daily.  Discussed timeline for medication effectiveness as well as possible side effects to  monitor for.  Offered counseling however she declined today and will let me know if she changes her mind in the future.  6. Other insomnia Likely related to overall increase in anxiety.  We will try to get this under control and if still having difficulty with sleeping at night, consider adding a low-dose trazodone.  7. Vitamin D deficiency Checking vitamin D. - VITAMIN D 25 Hydroxy (Vit-D Deficiency, Fractures)  8. Encounter for screening mammogram for malignant neoplasm of breast Up-to-date.  Completed with OB/GYN.  9. Diabetes mellitus screening 10. Family history of diabetes mellitus in father Checking hemoglobin A1c. - Hemoglobin A1c  11. Thyroid disorder screen 12. Family history of thyroid disease in mother Checking TSH. - TSH  13. Need for shingles vaccine Discussed risks versus benefits of vaccination as well as recommendations.  She is agreeable so Shingrix No. 1 given in office today.  She will be due for her next Shingrix vaccine in 2 to 6 months.  Okay to schedule this as a nurse visit if needed. - Varicella-zoster vaccine IM  Return in about 6 weeks (around 02/20/2022) for mood follow up.   Christen Butter, NP

## 2022-01-10 LAB — COMPLETE METABOLIC PANEL WITH GFR
AG Ratio: 1.8 (calc) (ref 1.0–2.5)
ALT: 25 U/L (ref 6–29)
AST: 25 U/L (ref 10–35)
Albumin: 4.8 g/dL (ref 3.6–5.1)
Alkaline phosphatase (APISO): 60 U/L (ref 37–153)
BUN: 15 mg/dL (ref 7–25)
CO2: 27 mmol/L (ref 20–32)
Calcium: 9.6 mg/dL (ref 8.6–10.4)
Chloride: 104 mmol/L (ref 98–110)
Creat: 0.69 mg/dL (ref 0.50–1.03)
Globulin: 2.6 g/dL (calc) (ref 1.9–3.7)
Glucose, Bld: 85 mg/dL (ref 65–99)
Potassium: 4.9 mmol/L (ref 3.5–5.3)
Sodium: 140 mmol/L (ref 135–146)
Total Bilirubin: 0.5 mg/dL (ref 0.2–1.2)
Total Protein: 7.4 g/dL (ref 6.1–8.1)
eGFR: 105 mL/min/{1.73_m2} (ref >=60)

## 2022-01-10 LAB — CBC WITH DIFFERENTIAL/PLATELET
Absolute Monocytes: 467 cells/uL (ref 200–950)
Basophils Absolute: 82 cells/uL (ref 0–200)
Basophils Relative: 1 %
Eosinophils Absolute: 139 cells/uL (ref 15–500)
Eosinophils Relative: 1.7 %
HCT: 43.3 % (ref 35.0–45.0)
Hemoglobin: 14.4 g/dL (ref 11.7–15.5)
Lymphs Abs: 2460 cells/uL (ref 850–3900)
MCH: 31.9 pg (ref 27.0–33.0)
MCHC: 33.3 g/dL (ref 32.0–36.0)
MCV: 95.8 fL (ref 80.0–100.0)
MPV: 10.2 fL (ref 7.5–12.5)
Monocytes Relative: 5.7 %
Neutro Abs: 5051 cells/uL (ref 1500–7800)
Neutrophils Relative %: 61.6 %
Platelets: 236 10*3/uL (ref 140–400)
RBC: 4.52 10*6/uL (ref 3.80–5.10)
RDW: 12.2 % (ref 11.0–15.0)
Total Lymphocyte: 30 %
WBC: 8.2 10*3/uL (ref 3.8–10.8)

## 2022-01-10 LAB — LIPID PANEL
Cholesterol: 200 mg/dL — ABNORMAL HIGH (ref <200)
HDL: 87 mg/dL (ref >=50)
LDL Cholesterol (Calc): 100 mg/dL (calc) — ABNORMAL HIGH
Non-HDL Cholesterol (Calc): 113 mg/dL (calc) (ref <130)
Total CHOL/HDL Ratio: 2.3 (calc) (ref <5.0)
Triglycerides: 50 mg/dL (ref <150)

## 2022-01-10 LAB — HEMOGLOBIN A1C
Hgb A1c MFr Bld: 5.3 % of total Hgb (ref <5.7)
Mean Plasma Glucose: 105 mg/dL
eAG (mmol/L): 5.8 mmol/L

## 2022-01-10 LAB — TSH: TSH: 1.05 mIU/L

## 2022-01-10 LAB — VITAMIN D 25 HYDROXY (VIT D DEFICIENCY, FRACTURES): Vit D, 25-Hydroxy: 118 ng/mL — ABNORMAL HIGH (ref 30–100)

## 2022-01-21 ENCOUNTER — Telehealth: Payer: Self-pay | Admitting: Medical-Surgical

## 2022-01-21 NOTE — Telephone Encounter (Signed)
Natalie Daniels   Natalie Daniels called and stated she had a severe reaction to the shingles vaccine and wanted to know if she needed to get the second one. She stated that she had vomiting, chills, and was out of work for 2 days due to the vaccine. I told her I would message you and you would let her know through Mychart your recommendation - CF  Arline Asp

## 2022-01-21 NOTE — Telephone Encounter (Signed)
Ideally, it is recommended to get the full series to get the best protection however if she had such severe symptoms, I am okay with listing it as an intolerance and foregoing the second vaccine.

## 2022-02-08 ENCOUNTER — Telehealth: Payer: Self-pay

## 2022-02-08 NOTE — Telephone Encounter (Signed)
Patient needs an in office appointment to be further evaluated for the little bumps that she has noticed on her arms, chest, and stomach that she noticed 08/17 since she started taking a generic of Lexapro.   I sent the patient a MyChart message to contact the office to schedule an appointment as well.

## 2022-02-08 NOTE — Telephone Encounter (Signed)
Patient needs an in office appointment to be further evaluated for the little bumps that she has noticed on her arms, chest, and stomach that she noticed 08/17 since she started taking a generic of Lexapro.   I sent the patient a MyChart message to contact the office to schedule an appointment as well. 

## 2022-02-11 NOTE — Telephone Encounter (Signed)
V.Mail left for patient to return call and schedule appt.

## 2022-02-12 NOTE — Progress Notes (Unsigned)
   Acute Office Visit  Subjective:     Patient ID: Natalie Daniels, female    DOB: Sep 07, 1970, 51 y.o.   MRN: [id number]  No chief complaint on file.   HPI Patient is in today for body rash.   ROS      Objective:    There were no vitals taken for this visit. {Vitals History (Optional):23777}  Physical Exam  No results found for any visits on 02/13/22.      Assessment & Plan:   Problem List Items Addressed This Visit   None   No orders of the defined types were placed in this encounter.   No follow-ups on file.  Charlton Amor, DO

## 2022-02-13 ENCOUNTER — Ambulatory Visit (INDEPENDENT_AMBULATORY_CARE_PROVIDER_SITE_OTHER): Payer: No Typology Code available for payment source | Admitting: Family Medicine

## 2022-02-13 VITALS — BP 129/83 | HR 88 | Ht 68.0 in | Wt 163.0 lb

## 2022-02-13 DIAGNOSIS — B354 Tinea corporis: Secondary | ICD-10-CM | POA: Diagnosis not present

## 2022-02-13 MED ORDER — PREDNISONE 20 MG PO TABS: 40.0000 mg | ORAL_TABLET | Freq: Every day | ORAL | 0 refills | Status: AC

## 2022-02-13 MED ORDER — TERBINAFINE HCL 250 MG PO TABS: 250.0000 mg | ORAL_TABLET | Freq: Every day | ORAL | 0 refills | Status: AC

## 2022-02-13 NOTE — Assessment & Plan Note (Addendum)
-   given patient terbinafine for 4 weeks and prednisone burst  - follow up in 4 weeks  - did tell patient this looks like tinea and she can restart her lexapro since it was working very well for her - if rash goes away within 2 weeks told her she can contact me and stop taking the medication.  - recommend washing sheets with hot water  - rash looks like ring worm on her forearm but it there are no central clearings on her abdomen or back. Woods lamp used and did not light up. Will treat as tinea corporis based on ringworm lesion on left forearm as this is likely what spread to back and chest

## 2022-02-20 ENCOUNTER — Telehealth (INDEPENDENT_AMBULATORY_CARE_PROVIDER_SITE_OTHER): Payer: No Typology Code available for payment source | Admitting: Medical-Surgical

## 2022-02-20 ENCOUNTER — Encounter: Payer: Self-pay | Admitting: Medical-Surgical

## 2022-02-20 DIAGNOSIS — F419 Anxiety disorder, unspecified: Secondary | ICD-10-CM

## 2022-02-20 NOTE — Progress Notes (Signed)
Virtual Visit via Video Note  I connected with Natalie Daniels on 02/20/22 at 11:10 AM EDT by a video enabled telemedicine application and verified that I am speaking with the correct person using two identifiers.   I discussed the limitations of evaluation and management by telemedicine and the availability of in person appointments. The patient expressed understanding and agreed to proceed.  Patient location: home Provider locations: office  Subjective:    CC: Mood follow-up  HPI: Very pleasant 51 year old female presenting via MyChart video visit for mood follow-up.  Approximately 6 weeks ago, she was started on Lexapro at 5 mg daily for 1 week with an increase to 10 mg daily after that.  She started the medication and tolerated it well with only some transient mild side effects during the first 2 weeks which resolved spontaneously.  She did have a rash breakout so thought it may have been related to the Lexapro.  She weaned herself down and stopped it completely for 2 days before she was able to be seen by one of my partners.  At the time, she was diagnosed with ringworm and reassured that the medication was not the cause.  She restarted the Lexapro and has been on it consistently for around 4-1/2 weeks.  Notes that her family has noticed some changes and she reports being able to let things go easier and not feeling as feisty.  She feels that her symptoms are 25-30% better overall and there is room for continued improvement.  Notes that she is still very anxious over things and tends to worry even when she is unable to make a change in the situation.  Denies SI/HI.   Past medical history, Surgical history, Family history not pertinant except as noted below, Social history, Allergies, and medications have been entered into the medical record, reviewed, and corrections made.   Review of Systems: See HPI for pertinent positives and negatives.   Objective:    General: Speaking clearly in  complete sentences without any shortness of breath.  Alert and oriented x3.  Normal judgment. No apparent acute distress.  Impression and Recommendations:    1. Anxiety [place] the timeline and effectiveness expectations for SSRIs.  She has not quite been on it for 6 full weeks at the full 10 mg dose so there may be a little bit of room for improvement even without making changes.  She would like to try increasing it to 15 mg daily to see if this is more beneficial.  Advised to take 1-1/2 of her 10 mg tablets supply each day to equal 15 mg.  Monitor for any additional side effects or intolerances.  Discussed expectations for management of anxiety in someone who has dealt with this on a lifelong basis.  Some things will be appropriate to feel anxious over and we do not want to numb her emotions completely.  She will still likely continue to be prone to anxiety when she feels less control over the situation.  Discussed setting reasonable goals for symptom improvement such as approximately 50% better or able to handle situations without significant panic that were previously intolerable.  She notes that she will be watchful for these things and will work on setting a realistic goal.  We will plan to touch base in another 4 weeks or so to see how she is doing on the increased dose.  In the meantime, she knows to reach out to me if she has any issues.  I discussed the assessment and  treatment plan with the patient. The patient was provided an opportunity to ask questions and all were answered. The patient agreed with the plan and demonstrated an understanding of the instructions.   The patient was advised to call back or seek an in-person evaluation if the symptoms worsen or if the condition fails to improve as anticipated.  25 minutes of non-face-to-face time was provided during this encounter.  Return in about 4 weeks (around 03/20/2022) for mood follow up.  Thayer Ohm, DNP, APRN, FNP-BC [place]  [facility]

## 2022-02-26 ENCOUNTER — Encounter: Payer: Self-pay | Admitting: Family Medicine

## 2022-02-28 ENCOUNTER — Other Ambulatory Visit: Payer: Self-pay | Admitting: Family Medicine

## 2022-02-28 MED ORDER — PREDNISONE 20 MG PO TABS: 40.0000 mg | ORAL_TABLET | Freq: Every day | ORAL | 0 refills | Status: AC

## 2022-03-02 ENCOUNTER — Encounter: Payer: Self-pay | Admitting: Medical-Surgical

## 2022-03-11 ENCOUNTER — Encounter: Payer: Self-pay | Admitting: Medical-Surgical

## 2022-03-20 ENCOUNTER — Encounter: Payer: Self-pay | Admitting: Medical-Surgical

## 2022-03-20 ENCOUNTER — Telehealth (INDEPENDENT_AMBULATORY_CARE_PROVIDER_SITE_OTHER): Payer: No Typology Code available for payment source | Admitting: Medical-Surgical

## 2022-03-20 DIAGNOSIS — R09A2 Foreign body sensation, throat: Secondary | ICD-10-CM | POA: Diagnosis not present

## 2022-03-20 DIAGNOSIS — F419 Anxiety disorder, unspecified: Secondary | ICD-10-CM

## 2022-03-20 MED ORDER — FAMOTIDINE 20 MG PO TABS: 20.0000 mg | ORAL_TABLET | Freq: Two times a day (BID) | ORAL | 1 refills | Status: DC | PRN

## 2022-03-20 MED ORDER — ESCITALOPRAM OXALATE 20 MG PO TABS: 20.0000 mg | ORAL_TABLET | Freq: Every day | ORAL | 1 refills | Status: DC

## 2022-03-20 NOTE — Progress Notes (Signed)
Virtual Visit via Video Note  I connected with Natalie Daniels on 03/20/22 at 11:10 AM EDT by a video enabled telemedicine application and verified that I am speaking with the correct person using two identifiers.   I discussed the limitations of evaluation and management by telemedicine and the availability of in person appointments. The patient expressed understanding and agreed to proceed.  Patient location: home Provider locations: office  Subjective:    CC: Mood follow-up  HPI: Very pleasant 51 year old female presenting via [place] video visit for mood follow-up.  She has been taking Lexapro 15 mg daily for the last 4 weeks, tolerating the increase in dose well.  Has not noticed much change in her current symptoms with the small dose increase and is very interested in increasing the dose to 20 mg daily.  Has noted that since the increase in her Lexapro, she has developed a feeling of something being stuck in the back of her throat.  Wonders if this is a typical side effect of using medication.  Denies any significant reflux symptoms and is not currently taking anything for acid production.  No sore throat, difficulty swallowing, or difficulty breathing.  Past medical history, Surgical history, Family history not pertinant except as noted below, Social history, Allergies, and medications have been entered into the medical record, reviewed, and corrections made.   Review of Systems: See HPI for pertinent positives and negatives.   Objective:    General: Speaking clearly in complete sentences without any shortness of breath.  Alert and oriented x3.  Normal judgment. No apparent acute distress.  Impression and Recommendations:    1. Anxiety Increase Lexapro to 20 mg daily.  Reviewed potential side effects of the medication including the percent of patients that have been found to have gastrointestinal concerns.  Dyspepsia is certainly a potential side effect.  She is currently happy with  the medication and wants to avoid switching if not absolutely necessary.  2. Globus sensation Suspect medication side effect versus LPR.  Recommend starting famotidine 20 mg twice daily for the next 1 to 2 weeks and then twice daily as needed thereafter.  If improvement in the globus sensation occurs with famotidine, that points toward LPR.  If no improvement, can consider trialing a PPI instead.  If no response to either of these interventions, we may need to look at switching to a comparable medication to Lexapro for anxiety management.  I discussed the assessment and treatment plan with the patient. The patient was provided an opportunity to ask questions and all were answered. The patient agreed with the plan and demonstrated an understanding of the instructions.   The patient was advised to call back or seek an in-person evaluation if the symptoms worsen or if the condition fails to improve as anticipated.  25 minutes of non-face-to-face time was provided during this encounter.  Return for Mood follow-up in approximately 3 months.  MyChart message checkin on new dose to be sent in 2 weeks.  Clearnce Sorrel, DNP, APRN, FNP-BC [facility]

## 2022-04-01 ENCOUNTER — Other Ambulatory Visit: Payer: Self-pay | Admitting: Medical-Surgical

## 2022-04-11 ENCOUNTER — Other Ambulatory Visit: Payer: Self-pay | Admitting: Medical-Surgical

## 2022-04-12 ENCOUNTER — Telehealth (INDEPENDENT_AMBULATORY_CARE_PROVIDER_SITE_OTHER): Payer: No Typology Code available for payment source | Admitting: Medical-Surgical

## 2022-04-12 ENCOUNTER — Encounter: Payer: Self-pay | Admitting: Medical-Surgical

## 2022-04-12 DIAGNOSIS — F419 Anxiety disorder, unspecified: Secondary | ICD-10-CM

## 2022-04-12 MED ORDER — SERTRALINE HCL 100 MG PO TABS: 100.0000 mg | ORAL_TABLET | Freq: Every day | ORAL | 3 refills | Status: DC

## 2022-04-12 NOTE — Progress Notes (Signed)
Virtual Visit via Video Note  I connected with Natalie Daniels on 04/12/22 at  1:00 PM EDT by a video enabled telemedicine application and verified that I am speaking with the correct person using two identifiers.   I discussed the limitations of evaluation and management by telemedicine and the availability of in person appointments. The patient expressed understanding and agreed to proceed.  Patient location: home Provider locations: office  Subjective:    CC: Anxiety follow-up  HPI: Pleasant 51 year old female presenting today via [place] video visit for follow-up on anxiety.  She has been taking Lexapro 20 mg daily, tolerating well without side effects.  Notes that the medication does not seem like it is helping anymore at the higher dose than it did at the 10 mg dose.  Feels that her anxiety is approximately 20-30% controlled and her goal is to have it 50% controlled.  Also reports that she has had some concerning issues with some side effects of the Lexapro.  Still has a feeling of something being stuck in her throat at times and feels this is related to the antidepression medication since treating it for possible GERD has not seem to help.  Previously took Zoloft after her child was born for postpartum depression but is not sure if this was helpful or not.  Does remember self discontinuing it.  She also tried Xanax but it left her feeling very out of it and she would like to avoid that.  Reports that a lot of her anxiety surrounds situational issues such as going to new places or doing new things.  Her mind tends to race with uncertainties and she ends up feeling frozen where she is unable to proceed with the new experience.  Reports that her husband is aware that she has this response and tends to push her out of her comfort zone.  Unfortunately, even if she proceeds with a new experience, she is often so anxious about the whole process that she does not remember a clearly.  Denies SI/HI.  Past  medical history, Surgical history, Family history not pertinant except as noted below, Social history, Allergies, and medications have been entered into the medical record, reviewed, and corrections made.   Review of Systems: See HPI for pertinent positives and negatives.   Objective:    General: Speaking clearly in complete sentences without any shortness of breath.  Alert and oriented x3.  Normal judgment. No apparent acute distress.  Impression and Recommendations:    1. Anxiety Discussed her current symptoms.  Feel that counseling would be very beneficial to help teach her self-regulation regarding her anxiety response to new experiences.  She is not at a point where she would like to proceed with this yet.  Discontinue Lexapro since its not been effective.  Switching to Zoloft 100 mg daily.  Discussed various options that may be helpful but plan to start here for now.  We will touch base in about 6 weeks to see how she is doing on this regimen and if there is any need for change in dose or addition of a different medication.  Advised that if she would like to see psychiatry at any point, I will happily make a referral if that makes her more comfortable.  Patient verbalized understanding is agreeable to the plan.  I discussed the assessment and treatment plan with the patient. The patient was provided an opportunity to ask questions and all were answered. The patient agreed with the plan and demonstrated an understanding  of the instructions.   The patient was advised to call back or seek an in-person evaluation if the symptoms worsen or if the condition fails to improve as anticipated.  25 minutes of non-face-to-face time was provided during this encounter.  Return in about 6 weeks (around 05/24/2022) for mood follow up.  Thayer Ohm, DNP, APRN, FNP-BC [facility]

## 2022-05-06 ENCOUNTER — Encounter: Payer: Self-pay | Admitting: Podiatry

## 2022-05-06 ENCOUNTER — Ambulatory Visit (INDEPENDENT_AMBULATORY_CARE_PROVIDER_SITE_OTHER): Payer: No Typology Code available for payment source | Admitting: Podiatry

## 2022-05-06 DIAGNOSIS — M7752 Other enthesopathy of left foot: Secondary | ICD-10-CM

## 2022-05-06 MED ORDER — TRIAMCINOLONE ACETONIDE 10 MG/ML IJ SUSP
10.0000 mg | Freq: Once | INTRAMUSCULAR | Status: AC
  Administered 2022-05-06: 10 mg

## 2022-05-08 NOTE — Progress Notes (Signed)
Subjective:   Patient ID: Natalie Daniels, female   DOB: 51 y.o.   MRN: [id number]   HPI Patient states she started to develop pain in her left foot again and not sure if she needs new orthotics.  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  Reappoint to recheck again may require ultimate surgery of shortening osteotomy depending on symptoms

## 2022-05-13 ENCOUNTER — Other Ambulatory Visit: Payer: Self-pay | Admitting: Medical-Surgical

## 2022-05-20 ENCOUNTER — Encounter: Payer: Self-pay | Admitting: Podiatry

## 2022-05-20 ENCOUNTER — Ambulatory Visit (INDEPENDENT_AMBULATORY_CARE_PROVIDER_SITE_OTHER): Payer: No Typology Code available for payment source | Admitting: Podiatry

## 2022-05-20 DIAGNOSIS — M7752 Other enthesopathy of left foot: Secondary | ICD-10-CM | POA: Diagnosis not present

## 2022-05-20 NOTE — Progress Notes (Signed)
Subjective:   Patient ID: Natalie Daniels, female   DOB: 51 y.o.   MRN: [id number]   HPI Patient states she is significantly improved but still has a little bit of discomfort in the left joint   ROS      Objective:  Physical Exam  Neurovascular status intact inflammation of the second MPJ which is improved mild bruising noted with patient bringing in her old orthotics     Assessment:  Inflammatory capsulitis second MPJ left improved     Plan:  H&P reviewed condition went ahead today applied thick plantar padding to reduce pressure against the joint surface and we may make her  Orthotics if symptoms persist and ultimately could require osteotomy if symptoms do not get under control

## 2022-05-27 ENCOUNTER — Encounter: Payer: Self-pay | Admitting: Medical-Surgical

## 2022-05-27 ENCOUNTER — Other Ambulatory Visit: Payer: Self-pay | Admitting: Medical-Surgical

## 2022-05-27 DIAGNOSIS — F419 Anxiety disorder, unspecified: Secondary | ICD-10-CM

## 2022-05-28 ENCOUNTER — Encounter: Payer: Self-pay | Admitting: Podiatry

## 2022-05-28 MED ORDER — SERTRALINE HCL 100 MG PO TABS: 200.0000 mg | ORAL_TABLET | Freq: Every day | ORAL | 3 refills | Status: DC

## 2022-05-30 MED ORDER — PANTOPRAZOLE SODIUM 20 MG PO TBEC: 20.0000 mg | DELAYED_RELEASE_TABLET | Freq: Every day | ORAL | 1 refills | Status: DC

## 2022-05-30 NOTE — Addendum Note (Signed)
Addended byChristen Butter on: 05/30/2022 07:16 AM   Modules accepted: Orders

## 2022-06-07 IMAGING — DX DG HAND COMPLETE 3+V*R*
3 series · 3 of 3 positions shown · non-contrast
Comparison: None.

CLINICAL DATA: Atraumatic right hand pain.

EXAM:
RIGHT HAND - COMPLETE 3+ VIEW

[hand pa]
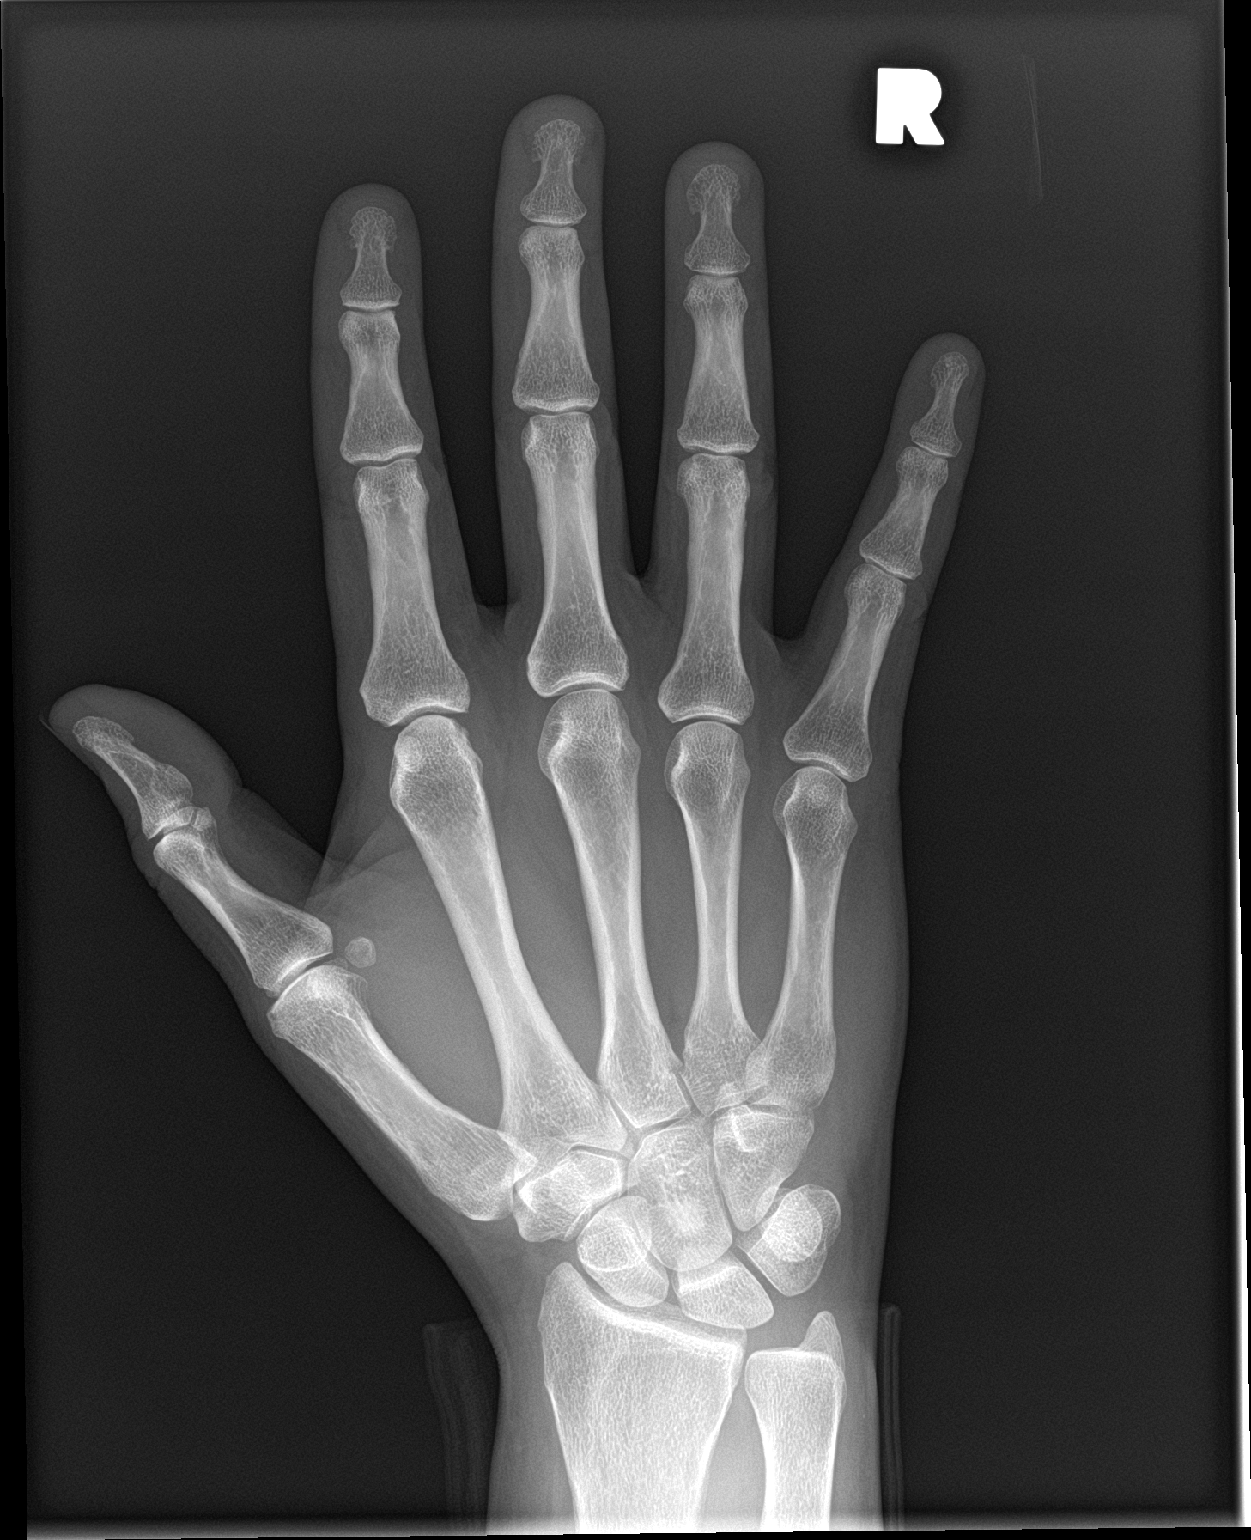

[hand obl]
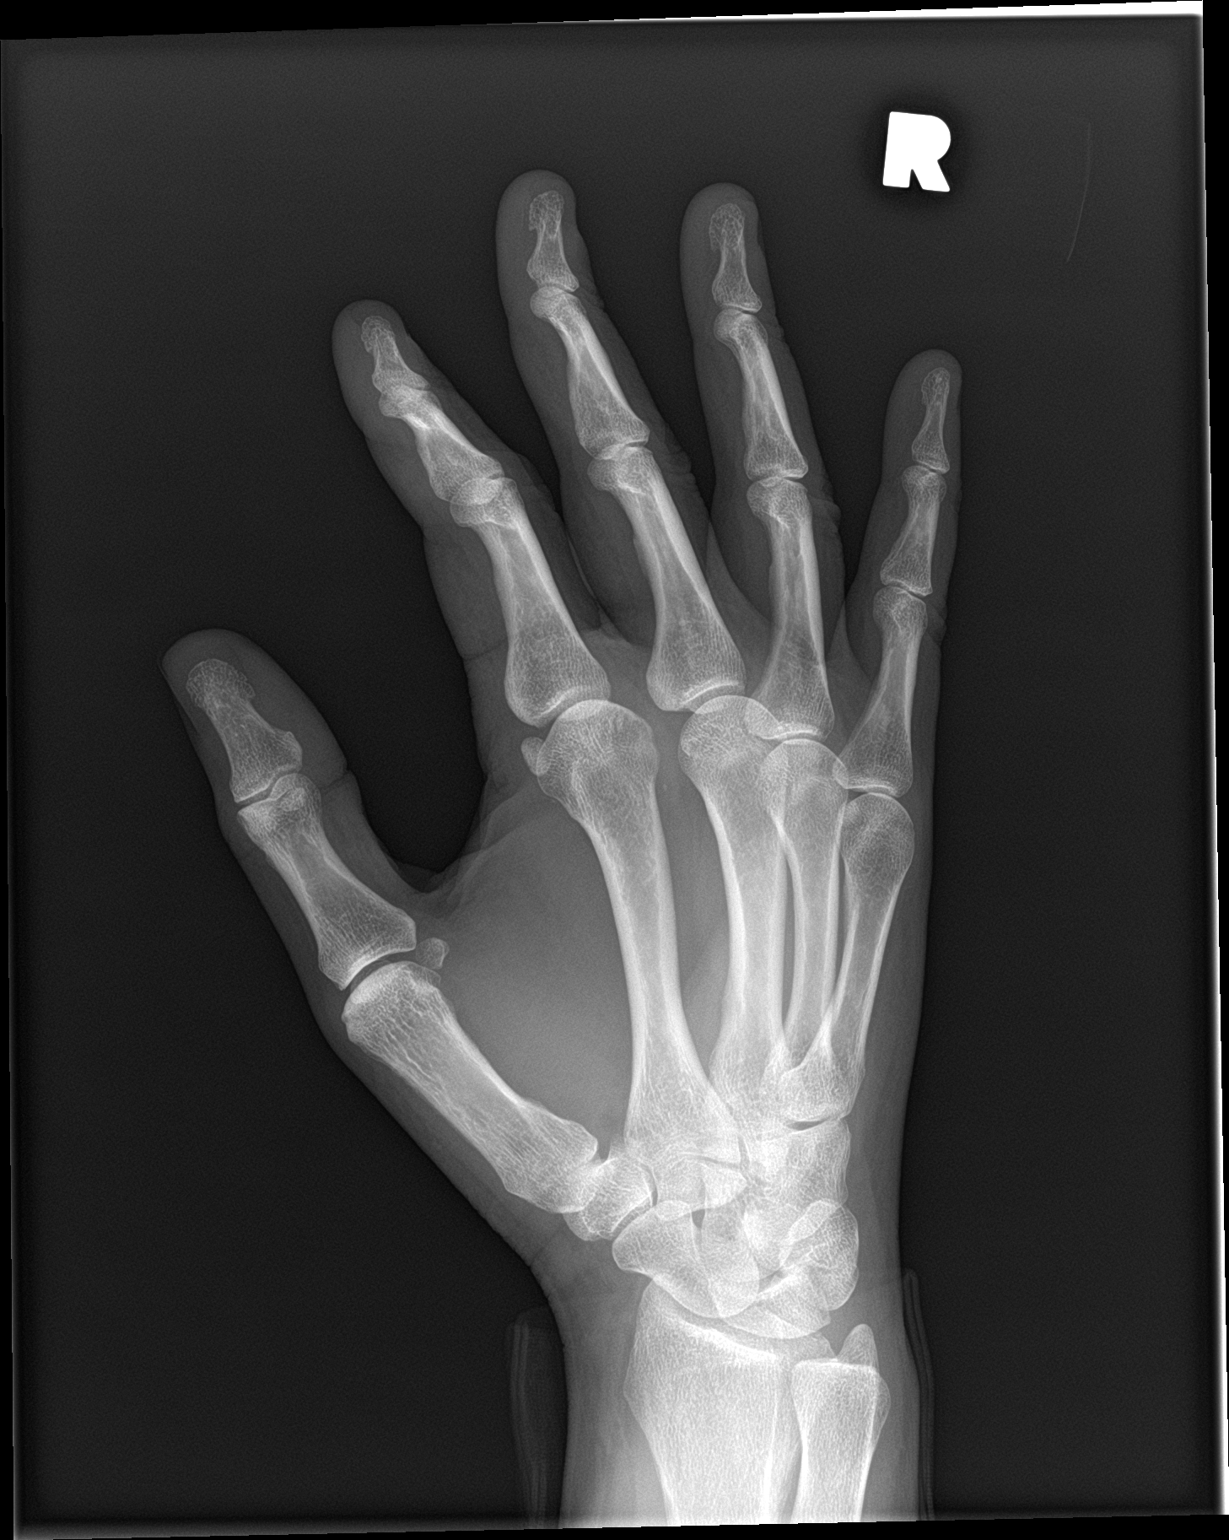

[hand lat]
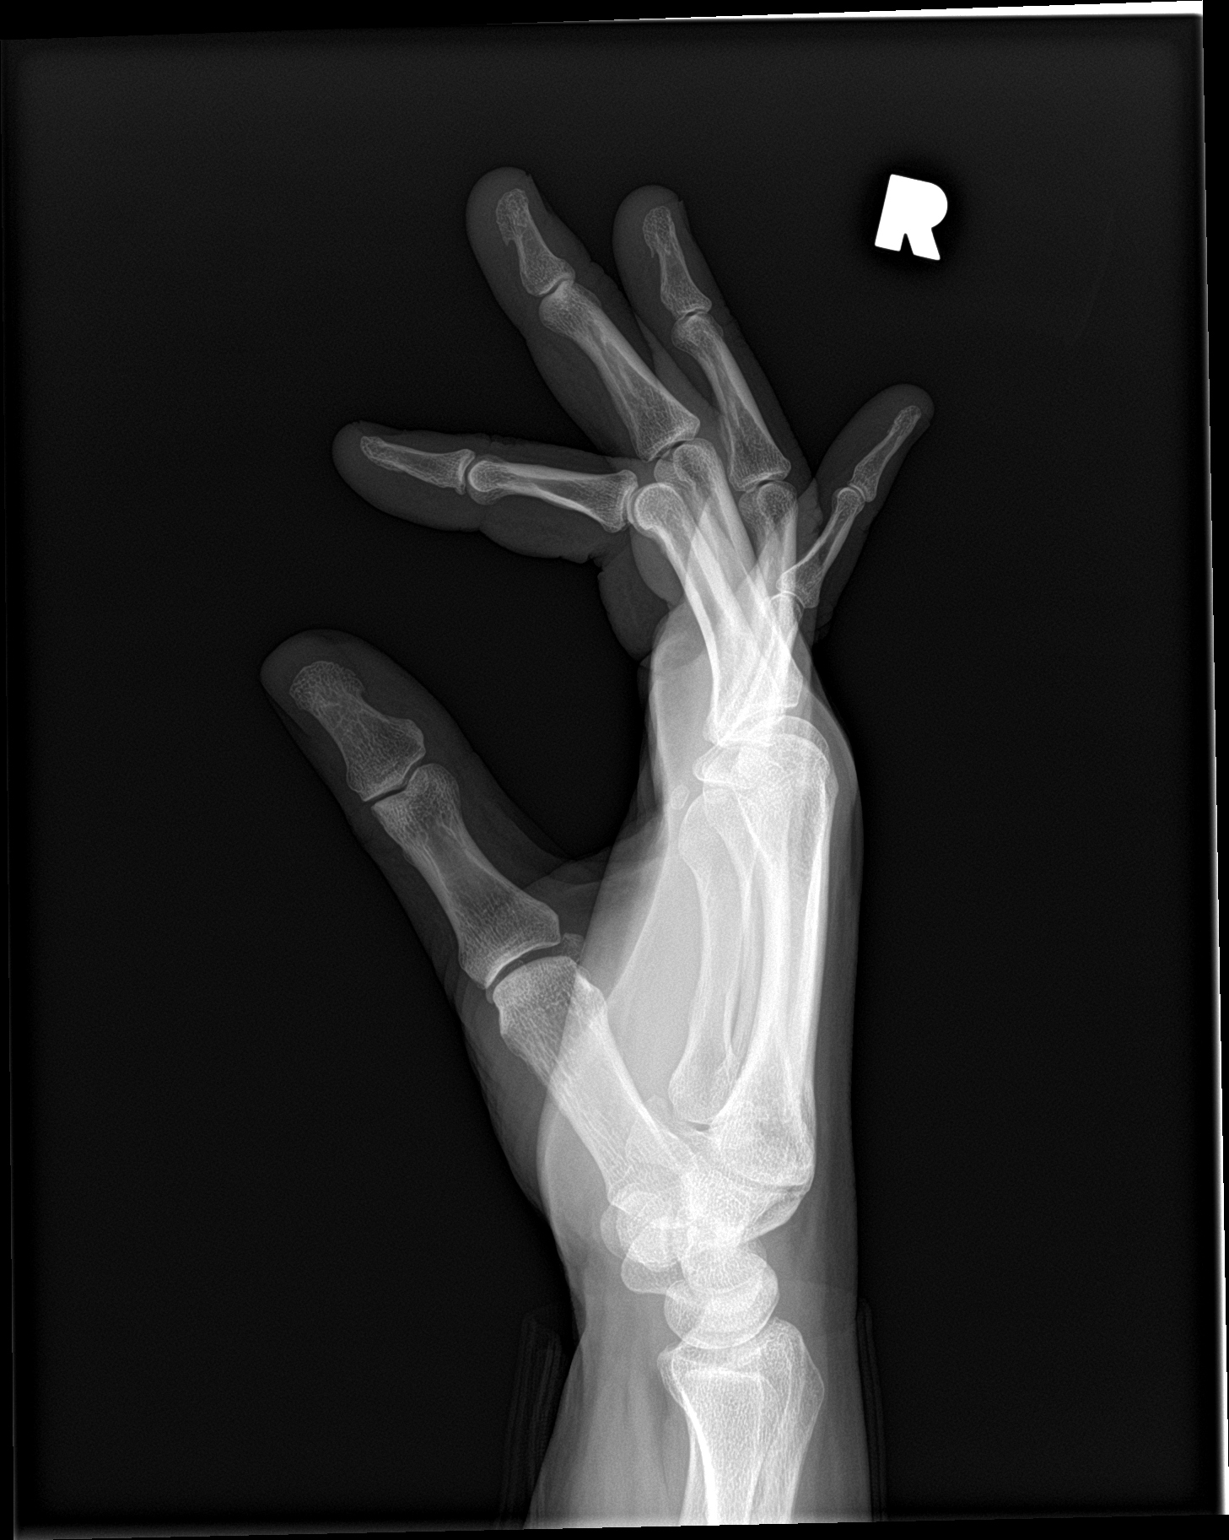

[3 of 3 positions shown; findings below may reference images not displayed]

FINDINGS: There is no evidence of fracture or dislocation. There is no
evidence of arthropathy or other focal bone abnormality. Soft
tissues are unremarkable.
IMPRESSION: Negative.

## 2022-06-25 MED ORDER — CITALOPRAM HYDROBROMIDE 20 MG PO TABS: 20.0000 mg | ORAL_TABLET | Freq: Every day | ORAL | 0 refills | Status: DC

## 2022-06-25 NOTE — Addendum Note (Signed)
Addended bySamuel Bouche on: 06/25/2022 04:21 PM   Modules accepted: Orders

## 2022-06-26 ENCOUNTER — Ambulatory Visit (INDEPENDENT_AMBULATORY_CARE_PROVIDER_SITE_OTHER): Payer: No Typology Code available for payment source | Admitting: Medical-Surgical

## 2022-06-26 ENCOUNTER — Encounter: Payer: Self-pay | Admitting: Medical-Surgical

## 2022-06-26 VITALS — BP 123/77 | HR 82 | Resp 20 | Ht 68.0 in | Wt 167.9 lb

## 2022-06-26 DIAGNOSIS — R198 Other specified symptoms and signs involving the digestive system and abdomen: Secondary | ICD-10-CM

## 2022-06-26 DIAGNOSIS — K649 Unspecified hemorrhoids: Secondary | ICD-10-CM | POA: Insufficient documentation

## 2022-06-26 MED ORDER — NYSTATIN 100000 UNIT/ML MT SUSP: 5.0000 mL | Freq: Four times a day (QID) | OROMUCOSAL | 0 refills | Status: AC

## 2022-06-26 NOTE — Progress Notes (Signed)
Established Patient Office Visit  Subjective   Patient ID: Shenita Trego, female   DOB: [birth year] Age: 52 y.o. MRN: [id number]   Chief Complaint  Patient presents with   WHITE SPOTS ON TONGUE   HPI Pleasant 52 year old female presenting today for evaluation of white spots that have developed on her tongue.  Notes that she first noted that the spots on the center of the back part of her tongue on December 20.  This coincided with an increase in her Zoloft dose from 150 mg daily to 200 mg daily.  She took her last dose of Zoloft yesterday and has now switched over to Celexa as instructed.  She also developed a small white spot on the left side of her tongue near the back as well.  The spots are not outright painful but they are uncomfortable.  These 2 spots feel swollen in the morning and it seems to improve throughout the day.  There are times that she feels as if she is going to gag because of the location of them.  She has not had any recent antibiotics or hospitalizations.  She does note that her mouth has been dry recently and she has been eating quite a few breaths labors on a daily basis.  She has tried multiple home remedies including baking soda and salt gargles/rinses, a over-the-counter mouthwash that contains alcohol, a prescription mouthwash from her dentist, and lemon juice with baking soda mouth rinses.  None of these have made any difference in her symptoms.  Former smoker but reports that she quit many many years ago.  No current use of e-cigarettes/vaping, chewing tobacco, snuff, or other drugs.   Objective:    Vitals:   06/26/22 1020  BP: 123/77  Pulse: 82  Resp: 20  Height: 5\' 8"  (1.727 m)  Weight: 167 lb 14.4 oz (76.2 kg)  SpO2: 99%  BMI (Calculated): 25.54    Physical Exam Vitals reviewed.  Constitutional:      General: She is not in acute distress.    Appearance: Normal appearance. She is not ill-appearing.  HENT:     Head: Normocephalic and atraumatic.      Mouth/Throat:     Lips: Pink.     Mouth: Mucous membranes are moist.     Tongue: Lesions present.     Pharynx: Oropharynx is clear. Uvula midline. No pharyngeal swelling, oropharyngeal exudate or posterior oropharyngeal erythema.     Tonsils: No tonsillar exudate.   Cardiovascular:     Rate and Rhythm: Normal rate and regular rhythm.     Pulses: Normal pulses.     Heart sounds: Normal heart sounds.  Pulmonary:     Effort: Pulmonary effort is normal. No respiratory distress.     Breath sounds: Normal breath sounds. No wheezing, rhonchi or rales.  Skin:    General: Skin is warm and dry.  Neurological:     Mental Status: She is alert and oriented to person, place, and time.  Psychiatric:        Mood and Affect: Mood normal.        Behavior: Behavior normal.        Thought Content: Thought content normal.        Judgment: Judgment normal.   No results found for this or any previous visit (from the past 24 hour(s)).     The 10-year ASCVD risk score (Arnett DK, et al., 2019) is: 2%   Values used to calculate the score:  Age: 26 years     Sex: Female     Is Non-Hispanic African American: No     Diabetic: No     Tobacco smoker: Yes     Systolic Blood Pressure: 992 mmHg     Is BP treated: No     HDL Cholesterol: 87 mg/dL     Total Cholesterol: 200 mg/dL   Assessment & Plan:   1. Tongue symptom Unclear etiology.  Consider drug side effect, irritant reaction, or oral thrush.  We have already stopped Zoloft so we will see if the symptoms improve.  Advised to cut back on breath savers and other use of lozenges over the next several days to prevent further irritation.  We will go ahead and treat with nystatin 5 mL 4 times daily x 10 days, swish and spit.  Hold in the mouth as long as possible to maximize exposure to the area.  If no symptoms at that time, we may need to do a scraping or biopsy for further investigation.  Return if symptoms worsen or fail to improve.  Plan to  follow-up via MyChart for mood in 3 to 4 weeks.  ___________________________________________ Clearnce Sorrel, DNP, APRN, FNP-BC [facility] [place]

## 2022-07-25 MED ORDER — CITALOPRAM HYDROBROMIDE 20 MG PO TABS: 30.0000 mg | ORAL_TABLET | Freq: Every day | ORAL | 0 refills | Status: DC

## 2022-07-25 NOTE — Addendum Note (Signed)
Addended bySamuel Bouche on: 07/25/2022 12:37 PM   Modules accepted: Orders

## 2022-08-26 NOTE — Addendum Note (Signed)
Addended bySamuel Bouche on: 08/26/2022 11:33 AM   Modules accepted: Orders

## 2022-09-24 ENCOUNTER — Ambulatory Visit (HOSPITAL_COMMUNITY): Payer: Self-pay | Admitting: Licensed Clinical Social Worker

## 2022-09-26 MED ORDER — NYSTATIN 100000 UNIT/ML MT SUSP: 500000.0000 [IU] | Freq: Four times a day (QID) | OROMUCOSAL | 0 refills | Status: DC

## 2022-09-26 NOTE — Addendum Note (Signed)
Addended byChristen Butter on: 09/26/2022 07:17 AM   Modules accepted: Orders

## 2022-10-09 ENCOUNTER — Ambulatory Visit (HOSPITAL_COMMUNITY): Payer: No Typology Code available for payment source | Admitting: Licensed Clinical Social Worker

## 2022-10-09 DIAGNOSIS — F411 Generalized anxiety disorder: Secondary | ICD-10-CM | POA: Diagnosis not present
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
## 2022-10-16 ENCOUNTER — Encounter (HOSPITAL_COMMUNITY): Payer: Self-pay

## 2022-10-16 ENCOUNTER — Ambulatory Visit (HOSPITAL_COMMUNITY): Payer: No Typology Code available for payment source | Admitting: Licensed Clinical Social Worker

## 2022-10-16 DIAGNOSIS — F411 Generalized anxiety disorder: Secondary | ICD-10-CM | POA: Diagnosis not present

## 2022-10-16 NOTE — Progress Notes (Signed)
Virtual Visit via Video Note  I connected with Natalie Daniels on 10/16/22 at  2:00 PM EDT by a video enabled telemedicine application and verified that I am speaking with the correct person using two identifiers.  Location: Patient: home Provider: home office   I discussed the limitations of evaluation and management by telemedicine and the availability of in person appointments. The patient expressed understanding and agreed to proceed.  I discussed the assessment and treatment plan with the patient. The patient was provided an opportunity to ask questions and all were answered. The patient agreed with the plan and demonstrated an understanding of the instructions.   The patient was advised to call back or seek an in-person evaluation if the symptoms worsen or if the condition fails to improve as anticipated.  I provided 52 minutes of non-face-to-face time during this encounter.  THERAPIST PROGRESS NOTE  Session Time: 2:00 PM to 2:52 PM  Participation Level: Active  Behavioral Response: CasualAlertAnxious and Euthymic  Type of Therapy: Individual Therapy  Treatment Goals addressed: Work on racing thoughts, patient describes she can spin and keep going on thing has to learn how to get out of that, work on balance, coping strategies for anxiety  ProgressTowards Goals: Progressing-initiated treatment plan but began to work on goals discussing ways to get out of racing thoughts including different relationship with thoughts, shift into another mode of activity, patient needing to work on attitudes such as letting go not being perfect  Interventions: CBT, Solution Focused, Strength-based, Supportive, and Other: mindfulness  Summary: Natalie Daniels is a 52 y.o. female who presents with with audio teaching or podcasts does well with them. Better in brian if write some things done when listening.  Therapist worked on treatment plan with patient gave consent to complete virtually talk about  some of the strategies were going to be using that pertain to her.  Patient does have avoidant behavior avoids going places stresses on details says right now additional stressor not talking to mother-in-law so worries if she will be there still wants relationship with her younger children.  Therapist remembered patient does not like going to new places.  Patient talked to husband about how she is tried so hard to do things differently from family and disappointed daughter knew she had anxiety.  She herself realizes was focused on something different that is developing a good relationship with daughter may be why did not focus on the anxiety.  Therapist talked to patient about her shadow self becoming more where we do not always noticed parts of ourselves also patient trying to be the pacemaker and making sure everything all right probably plays into her anxiety that she has to unlearn something she learned.  Wonders why now issues with anxiety.  Patient shared particulars that she has to make sure the house is clean before family comes home anxious if it is not done therapist suggested needing to share chores otherwise if she continues to do all the chores nothing is going to change.  This believes there has to be an attitude shift get done which she can get done and do the best also change the format of how family chores are done's.Therapy noted patient having to start to practice letting go things do not have to be perfect, does not have to have all things done necessary for patient to reduce anxiety and also work on another goal she has of balance.  Therapist noted prioritizing her time for self is important so she has  the energy to give to family.  Noted in video change her relationship to thoughts is a helpful strategy changing to another mode of being.  Another way to change relationship is to write things down, worry time as we talk clear patient needs an attitude shift to to manage anxiety.  Therapist asked  patient to practice something and not be perfect talked about letting go is freeing for patient.  This introduced mindfulness part of changing relationship to thought noticing thoughts and feelings to give her more management we will continue this as we did not complete this.  Reviewed what patient got from session she wrote down change relationship to thoughts shift to outside, worry time keep reminding herself worry time and let it go, practicing letting go. Practice something and don't make it perfect.   Suicidal/Homicidal: No  Plan: Return again in 4 weeks.2.  Patient look at [facility] for clinical interventions and description of mindfulness and review with therapist, therapist look at the call from [facility] health publishing on racing thoughts also CBT for anxiety  Diagnosis: Generalized anxiety disorder  Collaboration of Care: Other none needed  Patient/Guardian was advised Release of Information must be obtained prior to any record release in order to collaborate their care with an outside provider. Patient/Guardian was advised if they have not already done so to contact the registration department to sign all necessary forms in order for [place] to release information regarding their care.   Consent: Patient/Guardian gives verbal consent for treatment and assignment of benefits for services provided during this visit. Patient/Guardian expressed understanding and agreed to proceed.   Coolidge Breeze, LCSW 10/16/2022

## 2022-10-22 ENCOUNTER — Encounter: Payer: Self-pay | Admitting: Medical-Surgical

## 2022-10-22 ENCOUNTER — Ambulatory Visit (INDEPENDENT_AMBULATORY_CARE_PROVIDER_SITE_OTHER): Payer: No Typology Code available for payment source | Admitting: Medical-Surgical

## 2022-10-22 VITALS — BP 126/72 | HR 79 | Resp 20 | Ht 68.0 in | Wt 169.3 lb

## 2022-10-22 DIAGNOSIS — F419 Anxiety disorder, unspecified: Secondary | ICD-10-CM

## 2022-10-22 DIAGNOSIS — R21 Rash and other nonspecific skin eruption: Secondary | ICD-10-CM | POA: Diagnosis not present

## 2022-10-22 MED ORDER — ESCITALOPRAM OXALATE 10 MG PO TABS: 10.0000 mg | ORAL_TABLET | Freq: Every day | ORAL | 1 refills | Status: DC

## 2022-10-22 MED ORDER — CLOTRIMAZOLE-BETAMETHASONE 1-0.05 % EX CREA: 1.0000 | TOPICAL_CREAM | Freq: Two times a day (BID) | CUTANEOUS | 0 refills | Status: AC

## 2022-10-22 NOTE — Progress Notes (Signed)
        Established patient visit  History, exam, impression, and plan:  1. Rash Pleasant 52 year old female presenting today with reports of a rash on her right lower leg that has been present for approximately 2 months.  She has tried multiple over-the-counter options including hydrocortisone and an antifungal.  She did use bacitracin several times daily for a couple of weeks.  No relief from any of these measures.  Reports that the rash is very itchy and she was scratching at night.  Has been keeping it covered with a Band-Aid.  When the area gets dry, she has noted some scaling and crusting around the edges.  Originally started out as 1 spot but has formed a satellite rash a couple of inches above.  She does have a dog who recently had issues with ringworm but they were all treated successfully.  No new exposures to cosmetics, chemicals, or detergents.  No environmental exposures.  On exam, a 2.5 cm round area of erythema noted to the right shin with mild crusting at the borders.  The second site is approximately 1 cm x 1.5 cm a couple of inches higher.  She does have a healing scratch to the side of the original lesion.  No swelling, drainage, fluctuance, bleeding, excessive warmth, or tenderness to the areas.  After review of symptoms, failed measures, and possible exposures, we will treat with Lotrisone twice daily for up to 14 days.  If no better, she should let me know or schedule with her dermatologist for further evaluation. - clotrimazole-betamethasone (LOTRISONE) cream; Apply 1 Application topically 2 (two) times daily for 14 days.  Dispense: 45 g; Refill: 0  2. Anxiety Previous struggle with anxiety and depressive symptoms.  She has tried multiple SSRIs and had reactions to each 1.  Previously tolerated Lexapro very well and notes that lately she has been quite "feisty".  Notes that even her family has noticed and recommended she ask me to get restarted on Lexapro.  Now admits that the  medication may have been working for her to keep some of her symptoms and check and would like to get restarted.  Was previously on 20 mg daily but we will restart at 10 mg daily over the next 4 to 6 weeks and she can let me know if she wants to go up to 20 mg daily after that.  Procedures performed this visit: None.  Return if symptoms worsen or fail to improve.  __________________________________ Thayer Ohm, DNP, APRN, FNP-BC Primary Care and Sports Medicine [facility] [place]

## 2022-10-31 ENCOUNTER — Encounter: Payer: Self-pay | Admitting: Medical-Surgical

## 2022-10-31 MED ORDER — DESVENLAFAXINE SUCCINATE ER 50 MG PO TB24: 50.0000 mg | ORAL_TABLET | Freq: Every day | ORAL | 3 refills | Status: DC

## 2022-11-13 ENCOUNTER — Ambulatory Visit (INDEPENDENT_AMBULATORY_CARE_PROVIDER_SITE_OTHER): Payer: No Typology Code available for payment source | Admitting: Licensed Clinical Social Worker

## 2022-11-13 DIAGNOSIS — F411 Generalized anxiety disorder: Secondary | ICD-10-CM

## 2022-11-13 NOTE — Progress Notes (Signed)
Virtual Visit via Video Note  I connected with Natalie Daniels on 11/13/22 at  1:00 PM EDT by a video enabled telemedicine application and verified that I am speaking with the correct person using two identifiers.  Location: Patient: home Provider: office   I discussed the limitations of evaluation and management by telemedicine and the availability of in person appointments. The patient expressed understanding and agreed to proceed.   I discussed the assessment and treatment plan with the patient. The patient was provided an opportunity to ask questions and all were answered. The patient agreed with the plan and demonstrated an understanding of the instructions.   The patient was advised to call back or seek an in-person evaluation if the symptoms worsen or if the condition fails to improve as anticipated.  I provided 52 minutes of non-face-to-face time during this encounter.  THERAPIST PROGRESS NOTE  Session Time: 1:00 PM to 1:52 PM  Participation Level: Active  Behavioral Response: CasualAlertAnxious  Type of Therapy: Individual Therapy  Treatment Goals addressed: Work on racing thoughts, patient describes she can spin and keep going on thing has to learn how to get out of that, work on balance, coping strategies for anxiety  ProgressTowards Goals: Progressing-therapist educated patient on CBT and worked on noticing and challenging cognitive distortions as well as working on less avoidance  Interventions: CBT, Solution Focused, Strength-based, Supportive, and Other: coping  Summary: Natalie Daniels is a 52 y.o. female who presents with tried a new anxiety medicine Pristiq black floaters in her eyes so essential that find coping mechanism medicines are not working. Lexapro caused white bumps, white tongue. 10 minutes write down the worries not her issues. Anxiety at the time needs to know how to cope with this not what worry about, has things in check. Don't do things because anxiety  takes over and shared for example going somewhere worry about the parking other things go wrong.  Actually starting to walk at the park working at home trying to exercise. Be at the park not look at the phone.  Therapist very enthusiastic is this very helpful way of coping with mental health.  Patient describes episodes where anxiety happens. An event related to [place] all night didn't want to go didn't what to deal with anxiety, parking. Doesn't know how to get out of. Have done little things make a plan and start small. Now so cautious because did an activity and did not avoid but the car next to her got really close so started small and made it worse don't want to go but have to go but make somebody else drive. Gave another example drove a friend felt like look stupid in parking the car and then can't let it go. Thought friend thought she was stupid and then palms started sweat.  Therapist noted she is mind reading is a cognitive distortion as well as control issues patient says has these issues she is a planner has a plan.  Therapist pointed out patient has to have a better way of adjusting to life's pattern that we don't know the future, life is its unpredictable in the short run and the long-term and go with the flow more not fight the flow.  Therapist noted other attitude shifts help people learn to live comfortably with the unknown knowing risk is low something happens more than likely figure out how to cope. Also note note racing thoughts need to work on that as well to get her out of ruminating thoughts.  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  Looked at vicious cycle of anxiety that there is a trigger thought and emotion past experience but inevitably the thought is something bad is can happen and I will be able to  cope, future oriented therapist talked about the cognitive distortion with this also the catastrophic thinking.  Leads to the fighter flight uncomfortable feelings so behavior is to avoid.  We can break the cycle by challenging her thoughts therapist noted what were doing in session as well as changing what we do have last avoidance.  Therapist noted we challenge her thoughts whether there factor opinion whether they would stand up in a court of law.  Also helpful to do relaxation exercises positive that patient is walking this can be one of the best activities for mental health also qualifies as a relaxation exercise.  Therapist introduced mindfulness that patient will find helpful keeping her more present focused as well as noticing her thoughts and feelings more that we will help her with better strategies to cope.  Reviewed thought journal patient will write thoughts and reaction to strong feelings to catch distorted thoughts therapist also reviewed nature of automatic thoughts to know that it takes skill to begin to notice as they happen so quick, helpful sometimes to review his situation and noticed a strong feeling and noticed the thought attached sometimes catch the thought better.  Therapist provided space and support for patient to talk about thoughts and feelings in session.  Suicidal/Homicidal: No  Plan: Return again in 2 weeks.2.  Patient began thought journal was well as starting with small ways to have less avoidance therapist noted its to get used to it strategy the more she does at the more she will get comfortable with it.3.  Look at [facility] and [facility], look at [facility] challenging perfectionist thinking can continue look at thoughts and feelings book for strategies for CBT including feedback loop  Diagnosis: Generalized anxiety disorder  Collaboration of Care: Other review of primary care last note  Patient/Guardian was advised Release of Information  must be obtained prior to any record release in order to collaborate their care with an outside provider. Patient/Guardian was advised if they have not already done so to contact the registration department to sign all necessary forms in order for [place] to release information regarding their care.   Consent: Patient/Guardian gives verbal consent for treatment and assignment of benefits for services provided during this visit. Patient/Guardian expressed understanding and agreed to proceed.   Coolidge Breeze, LCSW 11/13/2022

## 2022-11-15 ENCOUNTER — Other Ambulatory Visit: Payer: Self-pay | Admitting: Medical-Surgical

## 2022-11-21 ENCOUNTER — Ambulatory Visit (INDEPENDENT_AMBULATORY_CARE_PROVIDER_SITE_OTHER): Payer: No Typology Code available for payment source | Admitting: Podiatry

## 2022-11-21 ENCOUNTER — Ambulatory Visit (INDEPENDENT_AMBULATORY_CARE_PROVIDER_SITE_OTHER): Payer: No Typology Code available for payment source

## 2022-11-21 DIAGNOSIS — M7752 Other enthesopathy of left foot: Secondary | ICD-10-CM | POA: Diagnosis not present

## 2022-11-21 DIAGNOSIS — M7751 Other enthesopathy of right foot: Secondary | ICD-10-CM | POA: Diagnosis not present

## 2022-11-22 ENCOUNTER — Encounter: Payer: Self-pay | Admitting: Medical-Surgical

## 2022-11-22 ENCOUNTER — Ambulatory Visit (INDEPENDENT_AMBULATORY_CARE_PROVIDER_SITE_OTHER): Payer: No Typology Code available for payment source | Admitting: Medical-Surgical

## 2022-11-22 VITALS — BP 129/87 | HR 86 | Temp 99.5°F | Ht 68.0 in | Wt 168.0 lb

## 2022-11-22 DIAGNOSIS — L989 Disorder of the skin and subcutaneous tissue, unspecified: Secondary | ICD-10-CM | POA: Diagnosis not present

## 2022-11-22 MED ORDER — VALACYCLOVIR HCL 1 G PO TABS: 2000.0000 mg | ORAL_TABLET | Freq: Two times a day (BID) | ORAL | 2 refills | Status: DC

## 2022-11-22 MED ORDER — MUPIROCIN 2 % EX OINT: TOPICAL_OINTMENT | CUTANEOUS | 3 refills | Status: DC

## 2022-11-22 NOTE — Progress Notes (Signed)
        Established patient visit  History, exam, impression, and plan:  1. Skin lesion of face Pleasant 52 year old female presenting today with reports of a sore that developed above her lips just under the nose about 3 weeks ago.  She thought this was a cold sore and has been using Abreva and doing very careful site care at home however it is not fully resolved.  Does note that she has had some bloody drainage out of it but she is now having yellow crusty drainage.  On evaluation she does have a small open lesion irregularly shaped with mild erythema and light yellow crusting visible.  With poor response to Abreva and the development of yellow crusty drainage, suspect possible impetigo.  Discussed options for treatment.  Will go ahead and start Valtrex 2000 mg twice daily for 2 doses.  Adding mupirocin ointment topically to the affected area 3 times daily for 1 week.  Continue stringent site care at home.  Procedures performed this visit: None.  Return if symptoms worsen or fail to improve.  __________________________________ Thayer Ohm, DNP, APRN, FNP-BC Primary Care and Sports Medicine [facility] [place]

## 2022-11-22 NOTE — Progress Notes (Signed)
Subjective:   Patient ID: Natalie Daniels, female   DOB: 52 y.o.   MRN: [id number]   HPI Patient states overall doing pretty well but needs new orthotics as they are controlling her well   ROS      Objective:  Physical Exam  Neurovascular status intact with inflammation of the second metatarsal phalangeal joint left and moderate chronic discomfort of the lesser MPJs bilateral     Assessment:  Inflammatory capsulitis left over right second MPJ     Plan:  Reviewed condition and recommended orthotics to be made new with the possibility recovery and old ones with casting's done today to offload second metatarsal phalangeal joint left and incorporate met pads bilateral

## 2022-11-23 ENCOUNTER — Other Ambulatory Visit: Payer: Self-pay | Admitting: Medical-Surgical

## 2022-11-27 ENCOUNTER — Ambulatory Visit (HOSPITAL_COMMUNITY): Payer: No Typology Code available for payment source | Admitting: Licensed Clinical Social Worker

## 2022-11-27 ENCOUNTER — Encounter: Payer: Self-pay | Admitting: Medical-Surgical

## 2022-11-27 ENCOUNTER — Telehealth (HOSPITAL_COMMUNITY): Payer: Self-pay | Admitting: Licensed Clinical Social Worker

## 2022-11-27 DIAGNOSIS — F411 Generalized anxiety disorder: Secondary | ICD-10-CM

## 2022-11-27 NOTE — Progress Notes (Signed)
Virtual Visit via Video Note  I connected with Natalie Daniels on 11/27/22 at  1:00 PM EDT by a video enabled telemedicine application and verified that I am speaking with the correct person using two identifiers.  Location: Patient: home Provider: home office   I discussed the limitations of evaluation and management by telemedicine and the availability of in person appointments. The patient expressed understanding and agreed to proceed.   I discussed the assessment and treatment plan with the patient. The patient was provided an opportunity to ask questions and all were answered. The patient agreed with the plan and demonstrated an understanding of the instructions.   The patient was advised to call back or seek an in-person evaluation if the symptoms worsen or if the condition fails to improve as anticipated.  I provided 50 minutes of non-face-to-face time during this encounter.  THERAPIST PROGRESS NOTE  Session Time: 1:00 PM to 1:50 PM  Participation Level: Active  Behavioral Response: CasualAlertEuthymicworking on anxiety issues  Type of Therapy: Individual Therapy  Treatment Goals addressed: Work on racing thoughts, patient describes she can spin and keep going on thing has to learn how to get out of that, work on balance, coping strategies for anxiety  ProgressTowards Goals: Progressing-patient actively working on goals taking steps to do activities to get out of comfort zone therapist utilizing CBT strategies to guide patient and helpful thoughts, worked on balance  Interventions: CBT, Solution Focused, Strength-based, Supportive, and Other: Coping  Summary: Natalie Daniels is a 52 y.o. female who presents with reviewed the thought journal with patient. Patient says hard notices the emotion is overwhelmed but heat in the moment doesn't notice the thought.  Therapist guided patient this is a skill to develop as well as some techniques that we will help as well as adding techniques  in the future such as mindfulness.. Small steps walking at the park 1-3 days trying to be consistent lost a friend used to work with her. Came down with cancer prayed for lost battle and really hard. It made her realize need to spend time with friends. Took a step and made a small step and met up from friend talked about counseling. Went to [employer] know this place how get there so met her someplace familiar with and the both went to [employer]. Did say to herself know [place] still worried about getting there.  Therapist noted significant events can really help [place] to open her eyes to things in this case at the same time help her move forward with her anxiety by realizing the significance of friendships so putting herself out of the comfort zone.  Noted therapist wants her to get to a place where she can but tell herself she can manage and it will be okay.  Didn't know how to get home and has floaters in her eye. Haven't driven at night used GPS went a different way didn't recognize it. She  say to herself I will get home someway by following the GPS, I will be fine.  Assessed talked about is still anxiety but noted the positive she used for coping. What said to herself helped.  [employer] anxiety but kept in check. A guy with cane kept asking her a question didn't make any eye contact. Followed her in the store. Was scary. Therapist agreed. Flight/fight probably harmless but scary when came to the door.  Therapist agreed fight/flight when that happens we do not think clearly and validated patient having a stranger follow  her was nerve-racking and again noting the positive of doing this activity nervous took the small step.  Was thinking on Monday about outfit for the weekend daughters to help guide her thinking by saying that is not now but a later activity to think about outfit. Patient noted, however, took step and look at what happened makes you go back to shell. Makes her want to find more items that we  will help with her safety but then again hard to figure out what would work.  Went in the store that was a step forward and therapist agrees.  Patient doesn't rest well. Have to keep going things done, has learned don't have to get all done, like to keep busy.  Therapist encourages self-care good for mental health as well. Walk but have to get all chores down. Feels guilty do something for themselves don't know how to stop that. It is her didn't cause any problems with family. 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 The whole balance thing has always been a struggle what she needs and what needs to be done. Feel guilty if don't do it. Everything done though is the payoff and then can relax instead of having to continue to worry about it. Friend feels guilty too wonders if a woman thing.  Shared another incident where she went out of her comfort zone has been avoiding a vacuuming car but did go but felt really stupid when there people were trying to guide her but because she was anxious could not follow their guidance thought herself people are looking at her seeing her look stupid.  Therapist guided her with more realistic perspective people are not that interested in her that this is a very normal and in common experience people doing new things not know what to do they be even feel stupid but have to expect doing new things were going to make mistakes realizing this is in such a big deal there is something wrong with people with their manage adjust for this.  Therapist shared her own personal experience for example when she is flying and has to check-in can ever get that right that this is very common experience for everybody. Patient said laughed at herself therapist noted good coping.    Noted patient's progress doing things out of her comfort zone will help her to become more comfortable with doing things that are new being able to recognize she can manage things that  are not under her control in fact many life situations are not completely under control.  Therapist noted she has to have a better relationship with this be able to recognize it will be okay even though she does not know how everything will turn out the way we have to navigate a lot of things in life.  Noted patient walking is a positive as well putting into her schedule positively reinforcing activities that help with mental health they are support and as the stress related activities.  Looked at a passage to emphasized this that the things we enjoy the things that move is closer to her goals are as important as daily responsibilities.  Looked at a way of organizing where can put urgent important not important to be able to separate activities and leave time and have room for important activities that do not need to be done right away but move her closer to her goal.  Reviewed patient going to vacuum car she  did it but felt stupid.  Therapist normalized experience or doing something new this is a common experience not feeling she is the only 1 in fact whenever were trying something new we can expect mistakes.  The other part is to downsize the significance noted one of the things patient has going she is able to laugh at herself recognition will get a make mistakes and the situations do not take a seriously as we do not need to.  Therapist provided support and space for patient to talk about thoughts and feelings in session.  Also reviewed CBT thought journal so patient has better idea to pay attention to the intense emotion and then noticed the thought patient noted that is difficult in the moment and therapist talked about how she could review the situation to where she had the intense emotion and may be able to capture the thought this way.  Suicidal/Homicidal: No  Plan: Return again in 2 weeks.2.  Patient continued to take steps out of comfort zone help with anxiety. 3.  Patient look at CBT website to help  her and learning CBT strategies 4.  Patient write down thoughts to notice thought distortions. 4.  Look at mindfulness also can look at [facility] challenging perfectionist thinking, thoughts and feelings workbook can look at handout on intrusive thoughts  Diagnosis: Generalized anxiety disorder  Collaboration of Care: Other none needed  Patient/Guardian was advised Release of Information must be obtained prior to any record release in order to collaborate their care with an outside provider. Patient/Guardian was advised if they have not already done so to contact the registration department to sign all necessary forms in order for [place] to release information regarding their care.   Consent: Patient/Guardian gives verbal consent for treatment and assignment of benefits for services provided during this visit. Patient/Guardian expressed understanding and agreed to proceed.   Coolidge Breeze, LCSW 11/27/2022

## 2022-12-11 ENCOUNTER — Ambulatory Visit (HOSPITAL_COMMUNITY): Payer: No Typology Code available for payment source | Admitting: Licensed Clinical Social Worker

## 2022-12-11 DIAGNOSIS — F411 Generalized anxiety disorder: Secondary | ICD-10-CM

## 2022-12-11 NOTE — Progress Notes (Signed)
THERAPIST PROGRESS NOTE  Session Time: 1:00 PM to 1:50 PM  Participation Level: Active  Behavioral Response: CasualAlertAnxious and Euthymic  Type of Therapy: Individual Therapy  Treatment Goals addressed: Work on racing thoughts, patient describes she can spin and keep going on thing has to learn how to get out of that, work on balance, coping strategies for anxiety  ProgressTowards Goals: Progressing-work with patient on using better tools for CBT could see patient spending to break thoughts down and separate them and challenge them separately therapist guided patient and thought challenge in session also looked at book on ruminating thought noting the problem itself is the overthinking and the sources of anxiety so as we have been working on need to address the anxiety that we will get to overthinking  Interventions: CBT, Solution Focused, Strength-based, and Supportive  Summary: Natalie Daniels is a 52 y.o. female who presents with created through record had questions for therapist asked if did it right and what do after created. Wrote situations emotions, physical sensations unhelpful thoughts and notices self-image issues.  Patient wanted to go on but therapist noted this was significant this was an example of a negative thought that needed to be challenged.  Patient related anxious after bought sundress brought it home and doesn't usually buy because older Spinning on issue not the right shoe, the right bra.  Self-conscious about being older in 43's. Self-image issues. Husband shouldn't care embrace struggle with it and doesn't know how to stop. How to stop the spinning..  What therapist noted was a series of thoughts and we needed to break them apart there was a thought it took me 3 hours to find a bra, people are all good and be looking at me all those are thoughts that can be challenged.  So we have to break the thoughts apart and then we have to ask her cells is this realistic are  helpful what else can I tell myself.  Therapist began the counter saying often 1 were going to buy bras it takes time and now she is going to get a sun dress so she is in a whole new area of looking for bras this is more realistic expectation.  Therapist noted understanding the exercises paying more attention to thoughts as part of what she wants to practice therapist spent more time talking about the nature of thoughts they are so automatic believable can catastrophize why this can be a challenge to notice.  Therapist guided patient and noticing the uncomfortable motion does with the thought she needs to pay attention to.  Noted it helps sometimes to review his situation and noticed the feeling you can recapture thought this way also to lengthen the thought out to see what she is actually telling herself.  Therapist also noted looking at something on rumination as we learn strategies to manage anxiety understand overthinking is the cause not the source of the overthinking the cause is anxiety the overthinking is not helpful not helpful and problem solving so we have to focus on the underlying Lovenia Shuck which is the anxiety therapist went on to say CBT is one of the ways we are working on that.  Therapist provided space and support for patient to talk about thoughts and feelings in session.  And had questions about CBT [place] website therapist liked the question about the skill of stop noting an acronym going through what it stands for is taking of breath observe what is going on take a step back practice  what works therapist noted the key here is an emotional regulation strategy to slow down noticed things and ultimately would be able to implement better coping.   . .   Suicidal/Homicidal: No  Plan: Return again in 1week.2.  Patient continue to practice noticing thoughts separate the thoughts and then challenge them, look at stop overthinking book, continue to look at thoughts and feelings work note core belief to  work on his self image issues  Diagnosis: Generalized anxiety disorder  Collaboration of Care: Other none needed  Patient/Guardian was advised Release of Information must be obtained prior to any record release in order to collaborate their care with an outside provider. Patient/Guardian was advised if they have not already done so to contact the registration department to sign all necessary forms in order for [place] to release information regarding their care.   Consent: Patient/Guardian gives verbal consent for treatment and assignment of benefits for services provided during this visit. Patient/Guardian expressed understanding and agreed to proceed.   Coolidge Breeze, LCSW 12/11/2022

## 2022-12-18 ENCOUNTER — Ambulatory Visit (HOSPITAL_COMMUNITY): Payer: No Typology Code available for payment source | Admitting: Licensed Clinical Social Worker

## 2022-12-18 DIAGNOSIS — F411 Generalized anxiety disorder: Secondary | ICD-10-CM | POA: Diagnosis not present

## 2022-12-18 NOTE — Progress Notes (Signed)
Virtual Visit via Video Note  I connected with Natalie Daniels on 12/18/22 at  4:00 PM EDT by a video enabled telemedicine application and verified that I am speaking with the correct person using two identifiers.  Location: Patient: home Provider: home office   I discussed the limitations of evaluation and management by telemedicine and the availability of in person appointments. The patient expressed understanding and agreed to proceed.   I discussed the assessment and treatment plan with the patient. The patient was provided an opportunity to ask questions and all were answered. The patient agreed with the plan and demonstrated an understanding of the instructions.   The patient was advised to call back or seek an in-person evaluation if the symptoms worsen or if the condition fails to improve as anticipated.  I provided 52 minutes of non-face-to-face time during this encounter.  THERAPIST PROGRESS NOTE  Session Time: 4:00 PM to 4:52 PM  Participation Level: Active  Behavioral Response: CasualAlertAnxious and Euthymic  Type of Therapy: Individual Therapy  Treatment Goals addressed: Work on racing thoughts, patient describes she can spin and keep going on thing has to learn how to get out of that, work on balance, coping strategies for anxiety  ProgressTowards Goals: Progressing-utilizing CBT strategies to challenge patient's unhelpful thoughts and rationally refute patient will continue to do this also identifying themes or core believes to work on such as self-consciousness,'s self-image, letting go and not spending  Interventions: CBT, Solution Focused, Strength-based, Supportive, and Reframing  Summary: Natalie Daniels is a 52 y.o. female who presents with hard time capturing the negative thoughts. Wrote scenarios where thoughts out of control. Spin on things. A lot of time can't come to conclusion so avoids it.  Let it go doesn't know how. Husband says he loves her the way she is.   Therapist noted this is a good feedback and we have to work on her feeling that way about herself.  Patient noted she changed that she has gotten older cat walked away she used to.  Therapist work with patient and acceptance of the aging process she is not good to be the way she was so she is fighting against something she is not going to change have some acceptance, do what she can at the same time needing to work with her on more unconditional self acceptance.  Patient shares she has tried has a Doctor, hospital that God loves her the way she is but finds it still hard to incorporate this.  Worked on thought challenging certain scenarios (see below) noting patient had some good challenges also noting other themes to incorporate such as working on self-consciousness, working on her "spinning"..   Patient talked about struggle finding the negative thoughts but as we explored situations therapist noted she is finding the negative thoughts.  She said for an example invited to a wedding at first excited but then began to worry about how she was good to look self-conscious about her knees.  Worked on challenges patient came up with a good challenge it is her friend invited her to wedding probably does not care about how her knees look.  Discussed how patient needs challenges that can stand up in a court of law.  Other issues to work around be around the negative thoughts self-image, being self-conscious working on self acceptance these are driving the negative thoughts.  Worried about her toes how they are going to look which leads her to spinning about whether to go to a new  salon or an old one.  Therapist again working on being self-conscious also worked on her spinning identifying not helpful to cut herself off it a point look at the pros and cons make the best decision she can.  Did patient having to work on letting go noted the serenity prayer can help with that looking at what she has power over it and not.   So as we work on challenging some core beliefs patient will continue to notice thoughts and challenge thoughts therapist explaining to keep the sheets if comes up again can use it and we may have to continue to go through the thought challenge until things are more believable for her.  Patient does not know what to do with her Friday the challenges for her to come up with a decision but only give herself 15 minutes.  Patient says she is bothered when family members tell her spending therapist encouraged her to look at it differently just as a piece of information and consider it as data and then she can decide what to do with it.  Assessed patient is putting a lot of effort into working on strategies indicates ovation and investment..   Suicidal/Homicidal: No  Plan: Return again in 4 weeks.2.  Patient continue to identify and challenge negative thoughts, need to also work on letting go, self image issues, self-consciousness, patient "spinning"  Diagnosis: Generalized anxiety disorder  Collaboration of Care: Other none needed  Patient/Guardian was advised Release of Information must be obtained prior to any record release in order to collaborate their care with an outside provider. Patient/Guardian was advised if they have not already done so to contact the registration department to sign all necessary forms in order for [place] to release information regarding their care.   Consent: Patient/Guardian gives verbal consent for treatment and assignment of benefits for services provided during this visit. Patient/Guardian expressed understanding and agreed to proceed.   Coolidge Breeze, LCSW 12/18/2022

## 2023-01-10 ENCOUNTER — Telehealth: Payer: Self-pay | Admitting: Podiatry

## 2023-01-10 NOTE — Telephone Encounter (Signed)
Lmom for pt to call back to set up appt to pick up orthotics    Balance paid in full

## 2023-01-16 ENCOUNTER — Ambulatory Visit (HOSPITAL_COMMUNITY): Payer: No Typology Code available for payment source | Admitting: Licensed Clinical Social Worker

## 2023-01-16 DIAGNOSIS — F411 Generalized anxiety disorder: Secondary | ICD-10-CM | POA: Diagnosis not present

## 2023-01-16 NOTE — Progress Notes (Signed)
Virtual Visit via Video Note  I connected with Natalie Daniels on 01/16/23 at  2:00 PM EDT by a video enabled telemedicine application and verified that I am speaking with the correct person using two identifiers.  Location: Patient: home Provider: home office   I discussed the limitations of evaluation and management by telemedicine and the availability of in person appointments. The patient expressed understanding and agreed to proceed.  I discussed the assessment and treatment plan with the patient. The patient was provided an opportunity to ask questions and all were answered. The patient agreed with the plan and demonstrated an understanding of the instructions.   The patient was advised to call back or seek an in-person evaluation if the symptoms worsen or if the condition fails to improve as anticipated.  I provided 47 minutes of non-face-to-face time during this encounter.  THERAPIST PROGRESS NOTE  Session Time: 2:00 PM to 2:47 PM  Participation Level: Active  Behavioral Response: CasualAlertAnxious and appropriate in session  Type of Therapy: Individual Therapy  Treatment Goals addressed:  Work on racing thoughts, patient describes she can spin and keep going on thing has to learn how to get out of that, work on balance, coping strategies for anxiety  ProgressTowards Goals: Progressing-working with patient on less "spinning" utilizing stop overthinking worksheet also tying in anxiety issues to past experience  Interventions: CBT, Solution Focused, Strength-based, Supportive, and Other: coping  Summary: Natalie Daniels is a 52 y.o. female who presents with 10 minute rule when time to think about it does better but in the moment it is hard. Patient gave an example of graduation invitation started to consider pros and cons and mind started spinning. Hard time to make a decision right then. When people say spinning feels attacking, annoying know trying to help way they are doing it.   Therapist pointed out setting side time for pros and cons is good practice and working on building more awareness in the moment we will take practice and developing skills like mindfulness.  Looked at worksheet stop overthinking for more tips to help patient Daughter signed her up for a photography class put aside time to make a decision. So can focus on it. Patient asked therapist for any techniques because she is not noticing the spinning in the moment.  Friend coming over usually prepare a lot in advance just asked what she wanted for snacks didn't stress. Wanted to enjoy the conversation. Patient is considering and wants to step out of comfort zone join a photography class. Shared her fear doesn't want to look stupid. Patient explains it still sticks with her sign over her door growing up parents put a sign over her door that said "think". Gave the impression that stupid couldn't do anything right. Anything she does is stupid. They said bell rang at school when patient become stupid fine until bell rang. Believe that contributes to her insecurity and anxiety to try something new why want a want a friend with her. At work different times think people judging her. Mom was not a very loving person was very selfish shouldn't be a mother estranged from her. Try to do well with daughter. Daughter says she knew patient had anxiety all life upsets patient didn't think she would notice does give daughter unconditional love felt that conditions with parent's love.   Therapist noted positive patient applying a 10-minute role to make decisions writing the pros and cons noted she has to get better at it in the moment.  Noted her practice is helping her develop skills friend coming over and not spending too much time to get ready, going beyond her comfort zone to join a photography class.  Therapist continues to encourage patient when she noticed that she is spending to give herself limits.  Therapist looked at worksheet on  overthinking to give patient more tips including there never is going to be a perfect decision so not to wait for that.  To rule out unlikely scenarios, Settling for good enough over perfect use your values as a roadmap realize that not deciding is a decision recognize that some decisions may not feel good except that risk is unavoidable know that every decision involves loss and compromise.  As we explored patient shared being exposed as being stupid people judging her was able to tie this to past experience with the mom who was critical remembers a sign over door that said think.  Therapist shared we can have trauma even over ordinary circumstances unprocessed memory why we continue to react from it and the goal in therapy is to process it and connected with more positive networks.  Will continue with mindfulness showed patient a short video why mindfulness is a superhero so she can become better at noticing her thoughts and feelings so she does not react but asked wisely and explained to patient it is a skill that takes time to get better at.  Described learning mindfulness as watching thoughts and feelings the way she would watch alee floating downstream or floats passing in a parade.  Therapist celebrated some of patient's successes including having a friend over trying to enjoy the experience instead of agonizing to get ready also considering a photography class which opens up nice experiences for her despite her anxiety. Suicidal/Homicidal: No  Plan: Return again in 1 week.2.  Patient look at stop overthinking worksheet also work with patient on mindfulness  Diagnosis: Generalized anxiety disorder  Collaboration of Care: Other none needed  Patient/Guardian was advised Release of Information must be obtained prior to any record release in order to collaborate their care with an outside provider. Patient/Guardian was advised if they have not already done so to contact the registration department to  sign all necessary forms in order for [place] to release information regarding their care.   Consent: Patient/Guardian gives verbal consent for treatment and assignment of benefits for services provided during this visit. Patient/Guardian expressed understanding and agreed to proceed.   Coolidge Breeze, LCSW 01/16/2023

## 2023-01-17 ENCOUNTER — Encounter: Payer: Self-pay | Admitting: Podiatry

## 2023-01-22 ENCOUNTER — Ambulatory Visit (HOSPITAL_COMMUNITY): Payer: No Typology Code available for payment source | Admitting: Licensed Clinical Social Worker

## 2023-01-22 DIAGNOSIS — F411 Generalized anxiety disorder: Secondary | ICD-10-CM | POA: Diagnosis not present

## 2023-01-22 NOTE — Progress Notes (Signed)
Virtual Visit via Video Note  I connected with Natalie Daniels on 01/22/23 at  2:00 PM EDT by a video enabled telemedicine application and verified that I am speaking with the correct person using two identifiers.  Location: Patient: home Provider: home office   I discussed the limitations of evaluation and management by telemedicine and the availability of in person appointments. The patient expressed understanding and agreed to proceed.   I discussed the assessment and treatment plan with the patient. The patient was provided an opportunity to ask questions and all were answered. The patient agreed with the plan and demonstrated an understanding of the instructions.   The patient was advised to call back or seek an in-person evaluation if the symptoms worsen or if the condition fails to improve as anticipated.  I provided 45 minutes of non-face-to-face time during this encounter.  THERAPIST PROGRESS NOTE  Session Time: 2:00 PM to 2:45 PM  Participation Level: Active  Behavioral Response: CasualAlertAnxious  Type of Therapy: Individual Therapy  Treatment Goals addressed: Work on racing thoughts, patient describes she can spin and keep going on thing has to learn how to get out of that, work on balance, coping strategies for anxiety  ProgressTowards Goals: Progressing-patient herself taking steps to work on anxiety helping her with progress worked on mindfulness today for her to better notice her thoughts to have more management of that, noticed in general her patterns so she can intervene to improve symptoms  Interventions: DBT, Solution Focused, Strength-based, Supportive, and Other: coping  Summary: Natalie Daniels is a 52 y.o. female who presents with went to photographer class not nervous more for Iphone and she has Android taught her some things spark on her learn to use her bigger camera.  Therapist actively working on treatment goals will help her make progress.  Patient also  looked at worksheet stop overthinking therapist sent.  Went to see "Inside out 2". So awesome simple and that hit home. Living with anxiety all life thought anxiety was her just a part and doesn't have to be in center. Have sadness, envy, kind, anxiety doesn't have to focus on the anxiety. Really helped after daughter asked her to go to school. There is security unfamiliar got anxious patient tested anxiety patient drove not familiar with roads did get anxious palm sweat told herself calm down, went through with it and anxiety went down. Had such a revelation at the end that hit home. Bad thoughts though have to put at back of brain and patient says has to have all the parts to make a person a whole person. Daughter also said as they passed a place that patient remembers the  anxious part remembers she was late behind a train and daughter said mom realize that everything you remember about something is your anxiety. That patient pays attention to that, what she remembers anxiety not the positive of in this case finding granite to do laundry room. Patient practiced not tell them the bad part of what learned at photography class, pushed through the negative that would cause her to spin.  Focused on the good thing one aspect was the negative and not everything and not focus on that.  Therapist tied this in with mindfulness the noticing that had helped her make interventions in this case focusing more on the positive.  Patient also shared were daughter uses patient's heart when people point now she is spinning so they use the word let us less emotionally charged patient said it is  like when a spinning lettuce container to dry it therapist like this take on it as well.      And patient both noticing how patient is taking active steps that is helping her make progress on treatment goals.  Even though anxious went to the photography class she is learning even if anxious anxiety goes away and in this case not as bad as  she thought this will help her with strategies for her anxiety.  She also went to the movie inside out very helpful insight from movie that she is not on her anxiety and its only part very very significant to be able to see herself in a more healthy and accurate way.  Patient also shared daughter pointing out noticed when she shares about things she remembers anxiety patient made a point of talking about her class and knowing being positive of the experience with her family.  Therapist noted this noticing is part of mindfulness and where can be used constructively to intervene and come up with a strategy.  Spent time in session describing why mindfulness can be helpful aspects of mindfulness, practices that help with the skill noting things like its about acceptance, observing and descriptive mode not thinking mode this noticing will help her noticed thoughts and feelings so she can fine treatment interventions.  Will help her notice unhelpful patterns of thinking but takes practice also guided patient to practice of mindfulness and letting go where she could use this to let go of negative thoughts feelings that she notices her breath patient will practice this week. Suicidal/Homicidal: No  Plan: Return again in 2 weeks.2.  Sinew to look at thoughts and feelings book, can look at mindfulness from EMDR book  Diagnosis: Generalized anxiety disorder  Collaboration of Care: Other none needed  Patient/Guardian was advised Release of Information must be obtained prior to any record release in order to collaborate their care with an outside provider. Patient/Guardian was advised if they have not already done so to contact the registration department to sign all necessary forms in order for [place] to release information regarding their care.   Consent: Patient/Guardian gives verbal consent for treatment and assignment of benefits for services provided during this visit. Patient/Guardian expressed understanding and  agreed to proceed.   Coolidge Breeze, LCSW 01/22/2023

## 2023-01-28 ENCOUNTER — Ambulatory Visit: Payer: No Typology Code available for payment source

## 2023-01-28 NOTE — Progress Notes (Signed)
Patient presents today to pick up custom molded foot orthotics, diagnosed with capsulitis by Dr. Charlsie Merles.   Orthotics were dispensed and fit was satisfactory. Reviewed instructions for break-in and wear. Written instructions given to patient.  Patient will follow up as needed.   Natalie Daniels Cped, CFo, CFm

## 2023-02-05 ENCOUNTER — Ambulatory Visit (HOSPITAL_COMMUNITY): Payer: No Typology Code available for payment source | Admitting: Licensed Clinical Social Worker

## 2023-02-05 DIAGNOSIS — F411 Generalized anxiety disorder: Secondary | ICD-10-CM

## 2023-02-05 NOTE — Progress Notes (Signed)
Virtual Visit via Video Note  I connected with Natalie Daniels on 02/05/23 at  1:00 PM EDT by a video enabled telemedicine application and verified that I am speaking with the correct person using two identifiers.  Location: Patient: home Provider: home office   I discussed the limitations of evaluation and management by telemedicine and the availability of in person appointments. The patient expressed understanding and agreed to proceed.   I discussed the assessment and treatment plan with the patient. The patient was provided an opportunity to ask questions and all were answered. The patient agreed with the plan and demonstrated an understanding of the instructions.   The patient was advised to call back or seek an in-person evaluation if the symptoms worsen or if the condition fails to improve as anticipated.  I provided 47 minutes of non-face-to-face time during this encounter.  THERAPIST PROGRESS NOTE  Session Time: 1:00 PM to 1:47 PM  Participation Level: Active  Behavioral Response: CasualAlertAnxious  Type of Therapy: Individual Therapy  Treatment Goals addressed: Work on racing thoughts, patient describes she can spin and keep going on thing has to learn how to get out of that, work on balance, coping strategies for anxiety  ProgressTowards Goals: Progressing-again on issue for patient needing to let go in different aspects and decision making, additionally finding balance ways to find balance with things such as to do list  Interventions: Solution Focused, Strength-based, Supportive, and Other: Coping  Summary: Natalie Daniels is a 52 y.o. female who presents with feeling overwhelmed with everything because has list of things to do keep having arguments with herself.  He feels takes 2 steps forward 1 step backward. Her  daughter will be with them less than a year before she leaves wants to spend as much time as possible at same time stresses about things that need done on the  list. But as spend time with family then list gets bigger. May do a   piece of something on list give up and feels overwhelming.  Shared some was on her list wants to declot her can be a pack rat for example collects carts and wants to keep the important ones Go through them and save ones really one to save.  Utilize mindfulness try to be in the moment. Try to put mindfulness put to use overwhelmed at work. Went for a walk go do mindfulness. Try to not think about try to talk to her daughter what learned taking pictures.  Noted we have to work on better ways for patient to let go that mindfulness exercise my not always be what works.  Patient wants a balance spent time with family, see friends if looking at list then not doing that.  Have time to do Bible study.  Things to her herself should do list but also wants time alone love Bible study.  Worked on cutting decision making time down coming up with balance that would work for her noting both are important that to do list and time with family, also important coping and gave patient positive feedback for allocating time for fun things that also is important and doing well there.  Patient applied her learning from today we will tackle the closet for only couple hours and then let it go and go spend time with friend and go to the bird store. .   As we tackled patient's problems in session today honing in on practicing letting go in different scenarios.  This applies to decision making only  giving herself so much time.  We practiced this with different issues does not know which time to give to her to do list then goes back and forth in making the decision.  Therapist related giving the to do list a couple hours and then going to see her friend and that being the end of it not keep deciding because that is wasted time where she is not going to come up with a better decision.  Has to order just for sister-in-law's wedding therapist came up with decision making give  herself to the end of October to see if she loses weight if so then she can keep going if not ordered address in this way not belaboring the decision making.  Therapist also noting to do lesser [place] give some time but to have balance coming up with a good balance between fun things positive that she does fun things and also some time to her responsibilities.  Another way to look at to do list this things will be always crossed off it just the nature of tasks and be more comfortable with that knowing it will stay on there until she gets it done lots of times to taking steps helps makes things not be overwhelming but in reality we may not get to her job until the weekend so we have to let it go stay in the list until we get it done.  Assess helpful session today as honing in on what we need to be working on with patient Suicidal/Homicidal: No  Plan: Return again in 2 weeks.2.  Focus on issue for patient of letting things go related to anxiety can also look at thoughts and feelings book, cognitive distortions ruminating thoughts book, thought diffusion 3.  Patient practice skills we talked about including cutting off amount of time she gives to making decisions, adjusting strategy with to do list does not need to cross things off but get started with that but still balance that with other activities  Diagnosis: Generalized anxiety disorder  Collaboration of Care: Other none needed  Patient/Guardian was advised Release of Information must be obtained prior to any record release in order to collaborate their care with an outside provider. Patient/Guardian was advised if they have not already done so to contact the registration department to sign all necessary forms in order for [place] to release information regarding their care.   Consent: Patient/Guardian gives verbal consent for treatment and assignment of benefits for services provided during this visit. Patient/Guardian expressed understanding and agreed to  proceed.   Coolidge Breeze, LCSW 02/05/2023

## 2023-02-19 ENCOUNTER — Ambulatory Visit (INDEPENDENT_AMBULATORY_CARE_PROVIDER_SITE_OTHER): Payer: No Typology Code available for payment source | Admitting: Licensed Clinical Social Worker

## 2023-02-19 DIAGNOSIS — F411 Generalized anxiety disorder: Secondary | ICD-10-CM | POA: Diagnosis not present

## 2023-02-19 NOTE — Progress Notes (Signed)
Virtual Visit via Video Note  I connected with Natalie Daniels on 02/19/23 at  2:00 PM EDT by a video enabled telemedicine application and verified that I am speaking with the correct person using two identifiers.  Location: Patient: home Provider: home office   I discussed the limitations of evaluation and management by telemedicine and the availability of in person appointments. The patient expressed understanding and agreed to proceed.  I discussed the assessment and treatment plan with the patient. The patient was provided an opportunity to ask questions and all were answered. The patient agreed with the plan and demonstrated an understanding of the instructions.   The patient was advised to call back or seek an in-person evaluation if the symptoms worsen or if the condition fails to improve as anticipated.  I provided 50 minutes of non-face-to-face time during this encounter.  THERAPIST PROGRESS NOTE  Session Time: 2:00 PM to 2:50 PM  Participation Level: Active  Behavioral Response: CasualAlertAnxious  Type of Therapy: Individual Therapy  Treatment Goals addressed: Work on racing thoughts, patient describes she can spin and keep going on thing has to learn how to get out of that, work on balance, coping strategies for anxiety  ProgressTowards Goals: Progressing-patient having trouble getting out of her spending added tools including limited thinking patterns to identify also slowing her reaction down so she can use rational brain to use her skills  Interventions: CBT, Solution Focused, Strength-based, Supportive, and Other: coping  Summary: Natalie Daniels is a 52 y.o. female who presents with feels like two steps forward 2-3 back. Something happened on Sunday August 25 hurt her 2nd toe husband and daughter went away. Did what therapist said 4 hours do stuff around the house then sit by the pool and relax. Did what can on list. 2nd toe hurt next day. Spiraling all weekend thinking  and crying that would need a shot in it out of control couldn't do thought record until three days spinning. Lost it on daughter projected can't go to [place] kept spinning. Not a healthy mom right now. What other techniques use patient asked besides thought record not working. Go outside mindfulness cry looks bad hold it which made it worse.  Therapist reframed crying therapeutically helps in healing letting it out.  Worked on patterns of limited thinking to get her out of spending.  Challenged her on being terrible mom reframed to explain she is learning a Marketing executive and give herself grace.. Patient says looking into  Volunteering more purpose in life. Hurt her toe thought now a hermit doesn't want to look like this so don't out and spun and spun. Called foot doctor was icing, doing Epson salt, pain is gone. Spun for no reason. When in it can't get out. Did thought record days later. Think has to do with hormones perimenopause can't control. Friend told her can't control it appointment not until Thursday but patient's mind goes to it is painful, can't walk, shots before put it off until later try to do it and let it go couldn't absorb. She was in a state now can see a catastrophe. Needs more help with letting go.  Good news follow suggestions from last session got things off list. Did things enjoy when family away. .   Patient noted it very hard when she starts spinning to get out of so introduce new topic Limited patterns of thinking noted filtering and catastrophizing therapist noted if it is hard for her to fill out a worksheet to do a  couple things that may help her get out of spiraling.  First pause then introduce some different patterns of thinking note them that may help her to realize that she is falling into faulty logic.  Applied example of her toe it got hurt she felt her negative and only looked at all her experiences before what happen turned out that it healed.  She also was catastrophizing  predicting the worst if she starts bending and noticing this thought pattern she may be able to get out of it when she sees the lack of logic.  Other issues are giving herself grace she is in the middle of learning skills not be too hard on herself the negative emotion does not help.  We may need to look at underlying issues, distortions core beliefs will also need to look at thought diffusion so that the thought is not so intense she can move away from it.  Noted the positives she is working on filtering from her past notices she remembers anxious memories also applied what we talked about did her to do list but was able to get balance between getting her chores done and enjoying herself.  Suicidal/Homicidal: No  Plan: Return again in 2 weeks.2.  How well addition of limited thinking patterns worked for patient, look at thought diffusion look at core beliefs look at trauma approach through C PT for trauma related cognitive distortions  Diagnosis: Generalized anxiety disorder  Collaboration of Care: Other none needed  Patient/Guardian was advised Release of Information must be obtained prior to any record release in order to collaborate their care with an outside provider. Patient/Guardian was advised if they have not already done so to contact the registration department to sign all necessary forms in order for [place] to release information regarding their care.   Consent: Patient/Guardian gives verbal consent for treatment and assignment of benefits for services provided during this visit. Patient/Guardian expressed understanding and agreed to proceed.   Coolidge Breeze, LCSW 02/19/2023

## 2023-02-20 ENCOUNTER — Ambulatory Visit: Payer: No Typology Code available for payment source | Admitting: Podiatry

## 2023-03-05 ENCOUNTER — Ambulatory Visit (HOSPITAL_COMMUNITY): Payer: No Typology Code available for payment source | Admitting: Licensed Clinical Social Worker

## 2023-03-05 DIAGNOSIS — F411 Generalized anxiety disorder: Secondary | ICD-10-CM

## 2023-03-05 NOTE — Progress Notes (Signed)
Virtual Visit via Video Note  I connected with Natalie Daniels on 03/05/23 at  2:00 PM EDT by a video enabled telemedicine application and verified that I am speaking with the correct person using two identifiers.  Location: Patient: home Provider: home office   I discussed the limitations of evaluation and management by telemedicine and the availability of in person appointments. The patient expressed understanding and agreed to proceed.   I discussed the assessment and treatment plan with the patient. The patient was provided an opportunity to ask questions and all were answered. The patient agreed with the plan and demonstrated an understanding of the instructions.   The patient was advised to call back or seek an in-person evaluation if the symptoms worsen or if the condition fails to improve as anticipated.  I provided 50 minutes of non-face-to-face time during this encounter.  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 female who presents with still spinning a couple of scenarios trying to identify sooner, reading identify thinking styles one big one is tunnel vision.  Mom wasn't maternal at all. Trying to clean things out her husband  and her are older but not old yet do it before doesn't have endurance to do it. Doesn't have anything from childhood important for daughter, Natalie Daniels, for her to have things from childhood. For patient told mom didn't have childhood things and she sent a paper box with note that she didn't have childhood stuff so here is goes. It was horrible. Becoming mom be the mom wanted memory boxes wanted to teach her daughter not afraid of things guess didn't do a great job went to counseling wasn't going to be the same type of mom as her mom. Counseling in 50's went in 20's, 30's, 40's patient asks when ok and healthy. Memory box keep it daughter is 20 she wanted to know what to do with stuff. Actually cried. Niece after 17 years wants what patient wants memories and pictures. Try to do the things mom didn't do thought doing good.  Doesn't have to save everything. Go through stuff led to spinning save cards patient is emotional sensitive save every one. What do with it?  Still struggling and spinning a pile in office deal with it later don't want to get rid of nice sentiment mean something to her. Uncle bill called her Mitz not be here for too long saved cards important go through grieving. Don't know people.got rid of cards. Patient sentimental. Filter again.  Therapist still things good filtering going on.  Bible study first time say heard spinning doesn't know other people like her never heard "I just spin". Sign from the [facility] you have anxiety dealing with it other people have spinning too. Hard not easy patient does not have anyone to look up to just her  kids Natalie Daniels in [place].   Another example of spinning fixing dog dollar spinning on it. Whole unit for invisible fence.  Patient gave other examples planning to drop car off wanted to know she sees herself as a planner family says she worries.  Always been the mother always have to fall on her shoulders nobody else has to think about it. Saying worrying and thinks  planner.  Therapist agrees (see below) still reasonable problem solving but noticed people's process or different.    .     Reviewed with the patient besides noticing distorted thought processes looking at ruminating thoughts noticing when it is healthy problem solving when it is not applied that to particular examples like sorting through cards.  Noted some filtering and that being healthy problem solving.  Also if it is causing distress.  Noted the problem is and what were worried about the underlying problem is the anxiety finding strategies to work on managing anxiety.  Noted there are different causes for anxiety there are life stressors so factors that contribute to anxiety stress being source for negative spirals but helping patient with skills so gets better with this.  Noted patient gave example of dropping off car she wanted to know what time where the place was family says wearing as we talked about wearing therapist noted she was still in healthy problem solving so more a planner patient has been a planner in the family has had to be other family members have not had to take that role.  Noted her process is different so when talking to family to point out there process is different not on help the problem solving therapist also noted to respect each process so contain the planning not go overboard. Suicidal/Homicidal: No  Plan: Return again in 2 weeks.2.  Look more ruminating thoughts left off what causes anxiety, can look at thought diffusion core beliefs  Diagnosis: Generalized anxiety disorder  Collaboration of Care: Other none needed  Patient/Guardian was advised Release of Information must be obtained prior to any record release in order to collaborate their care with an outside provider. Patient/Guardian was advised if they have not already done so to contact the registration department to sign all necessary forms in order for [place] to release information regarding their care.   Consent:  Patient/Guardian gives verbal consent for treatment and assignment of benefits for services provided during this visit. Patient/Guardian expressed understanding and agreed to proceed.   Coolidge Breeze, LCSW 03/05/2023

## 2023-03-21 ENCOUNTER — Telehealth: Payer: Self-pay | Admitting: Podiatry

## 2023-03-21 NOTE — Telephone Encounter (Signed)
Pt called and is wanting to order a 2nd pair of orthotics. She was told if ordered within 6 months they would be half price. If any isses please call the pt.

## 2023-03-24 NOTE — Telephone Encounter (Signed)
2nd pr of orthotics on order  Patient will owe $245 when she picks up  Patient is ok to pu, unless she thinks they will need to be trimmed  Addison Bailey Cped,CFo, CFm

## 2023-03-26 ENCOUNTER — Ambulatory Visit (INDEPENDENT_AMBULATORY_CARE_PROVIDER_SITE_OTHER): Payer: No Typology Code available for payment source | Admitting: Licensed Clinical Social Worker

## 2023-03-26 DIAGNOSIS — F411 Generalized anxiety disorder: Secondary | ICD-10-CM | POA: Diagnosis not present

## 2023-03-26 NOTE — Progress Notes (Signed)
Virtual Visit via Video Note  I connected with Natalie Daniels on 03/26/23 at  2:00 PM EDT by a video enabled telemedicine application and verified that I am speaking with the correct person using two identifiers.  Location: Patient: home Provider: home office   I discussed the limitations of evaluation and management by telemedicine and the availability of in person appointments. The patient expressed understanding and agreed to proceed.   I discussed the assessment and treatment plan with the patient. The patient was provided an opportunity to ask questions and all were answered. The patient agreed with the plan and demonstrated an understanding of the instructions.   The patient was advised to call back or seek an in-person evaluation if the symptoms worsen or if the condition fails to improve as anticipated.  I provided 54 minutes of non-face-to-face time during this encounter.  THERAPIST PROGRESS NOTE  Session Time: 2:00 PM to 2:54 PM  Participation Level: Active  Behavioral Response: CasualAlertAnxious and appropriate  Type of Therapy: Individual Therapy  Treatment Goals addressed: Work on racing thoughts, patient describes she can spin and keep going on thing has to learn how to get out of that, work on balance, coping strategies for anxiety  ProgressTowards Goals: Progressing-work with patient's anxiety around upcoming trip and airport, negative self evaluation looking for healthier sense of value, ways to establish her identity that are multifaceted and healthy  Interventions: CBT, Solution Focused, Strength-based, Supportive, and Other: self-esteem  Summary: Natalie Daniels is a 52 y.o. female who presents with going to [place] on 18th will see family and friends daughter went this summer godfather failing need to get there. He is on Mom's side her cousin call him uncle because godfather he has a lot of anxiety. Doesn't like to go to new places grandmother didn't like go to  new places. Can see how this runs in family.  Patient wants help because she has to  fly alone worry already. Tips so don't shut down and don't understand what they are telling her. Going to [place] more stressful. TSA always pulls her over  search all the time.  Really bothers her working on packing lightly going to a lot of places. Think will start spinning looking a different videos find the right one so doesn't take a big suitcase. (Worked on perspective taking with this see below) Had a breakdown went through closets pants didn't fit donation pile. Feel that changed overnight doesn't understand hasn't changed eating habits, thinks menopause and friend said have to  embrace that. Weight shifted didn't gain weight move to a spot where harder to put pants on has a problem when visit that people think she is fat. Accept it have to grieve it haven't done grieving emotional so procrastinating. Thinks help not to look at size just to see if make feel comfortable. Has to work on embracing it going through the motions. Thinks keep the pants may get back to them but it has been years. Not on hormones having an impact, things going on medically can't exercise the way she wants.  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  Thinks she would lose value if her laid off realizes though has to give it to God.  Therapist work with patient on developing healthier sense of identity that she is not just that 1 experience but is some of many experiences. Patient shares that didn't go to college what can offer? She did have  revelation worked 34 years want  to retire don't have to work 34 years at another company only put in time. Patient says why so important to her to retire there why a big deal tunnel vision. Struggle to find her gifts there is got to be more what is she supposed to be doing.  Is a positive aspect on that developing herself more more growth.  Looked at doing hobbies as a way to find more.  She has  looked at things can do. She has defined her fifties finding where good at. Husband says why because they have arrived and patient thinks there is more.  Thinks this may come from listening to certain people spirituality ask those questions about herself still looking to find a small church.  Took classes didn't get degree. Daughter is in leadership for woman [employer]. Wants to be a leader. Had to look over somebody's profile her mentor. Couldn't understand a lot of what they were saying. She usually doesn't come to her comes more for the emotional guidance,about girlfriends husband more academic, scholarship. She came to patient and asked Mom help me. Patient  felt good this is a piece didn't provide for her felt good and could explain. Felt good that she looked to patient for explaining this type of thing.  Self-conscious does not have college can't always explain some things felt good to help her and continue to help her with things like interview. 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  She able to say by doing it so much less self-conscious in that world. Therapist impressed with that.   .   Work with patient on her anxiety related to going to the airport alone therapist shared her own mishaps her struggles even traveling so many times to normalize her experience happens to all of [place] often therapist shared she usually goes to because she cannot figure it out patient always has that option we will need to help her figure it out.  Patient's afraid she is going to spend with packing lightly again therapist shared her war stories how you can do your best and travel a lot and still make mistakes she may never get to that perfect solution again cut herself off after so many videos.  Patient's other issue is menopause body changing having a hard time with that therapist work with patient on having a healthy sense of identity and value not coming from locks  having something more realistic and helpful such as as long as him healthy and taking care of myself then I can feel good about myself.  Shared concepts of we have unconditional worth so we have value the same time we do not want to stagnate we want to keep working on her self noted ways she can identify noted working as a way to identify but if she lost work that would not change her value and there is other things she gets her identity from from being a mom being a wife her spirituality all these different parts of herself make up who she is.  Therapist noted she lost her job she is not defined by that.  But by the accumulation of all the years I think she has succeeded in.  The now will change and we will move onto the next thing so it will not define who she is in that moment.  Noted the positive daughter coming to her for help in an area she is not used to  helping her and that felt good therapist noted she is good patient was able to appreciate herself also maybe help her enhance her sense of her value in helping her daughter sees some of her strengths.  Looked at [facility] sheet for patient to begin to review challenges to thoughts for example looking in the mirror I look fat challenge it with something more realistic and helpful in repeat that so she retrain her brain.  Therapist provided support and space for patient to talk about thoughts and feelings in session. Suicidal/Homicidal: No  Plan: Return again in 3 weeks.2.  Patient look at handout self-esteem, patient look at [facility] sheet to challenge stuck points in this case a healthier sense for identity can also look at book on ruminating thoughts left off at anxiety thought diffusion core beliefs  Diagnosis: Generalized anxiety disorder  Collaboration of Care: Other none needed  Patient/Guardian was advised Release of Information must be obtained prior to any record release in order to collaborate their care with an outside provider. Patient/Guardian was  advised if they have not already done so to contact the registration department to sign all necessary forms in order for [place] to release information regarding their care.   Consent: Patient/Guardian gives verbal consent for treatment and assignment of benefits for services provided during this visit. Patient/Guardian expressed understanding and agreed to proceed.   Coolidge Breeze, LCSW 03/26/2023

## 2023-04-15 ENCOUNTER — Telehealth: Payer: Self-pay | Admitting: Podiatry

## 2023-04-15 NOTE — Telephone Encounter (Signed)
Pt calling to check status of 2nd pair of orthotics that were ordered beginning of October. I did tell her this time of year tends to take longer but would ask and would call with an update.

## 2023-04-16 ENCOUNTER — Ambulatory Visit (HOSPITAL_COMMUNITY): Payer: No Typology Code available for payment source | Admitting: Licensed Clinical Social Worker

## 2023-04-16 DIAGNOSIS — F411 Generalized anxiety disorder: Secondary | ICD-10-CM

## 2023-04-16 NOTE — Progress Notes (Signed)
Virtual Visit via Video Note  I connected with Natalie Daniels on 04/16/23 at  2:00 PM EDT by a video enabled telemedicine application and verified that I am speaking with the correct person using two identifiers.  Location: Patient: home Provider: home office   I discussed the limitations of evaluation and management by telemedicine and the availability of in person appointments. The patient expressed understanding and agreed to proceed.   I discussed the assessment and treatment plan with the patient. The patient was provided an opportunity to ask questions and all were answered. The patient agreed with the plan and demonstrated an understanding of the instructions.   The patient was advised to call back or seek an in-person evaluation if the symptoms worsen or if the condition fails to improve as anticipated.  I provided 46 minutes of non-face-to-face time during this encounter.  THERAPIST PROGRESS NOTE  Session Time: 2:00 PM to 2:46 PM  Participation Level: Active  Behavioral Response: CasualAlertEuthymic  Type of Therapy: Individual Therapy  Treatment Goals addressed: Work on racing thoughts, patient describes she can spin and keep going on thing has to learn how to get out of that, work on balance, coping strategies for anxiety  ProgressTowards Goals: Progressing-positive coping patient using for conflict for uncomfortable new situations, not using any unhelpful thinking styles we will continue to hone in on anxiety as well as self-esteem  Interventions: Solution Focused, Strength-based, Supportive, and Other: coping  Summary: Natalie Daniels is a 52 y.o. female who presents with hurt her knee in October 11 concerned going to [place] wanted to see niece, friend and family member. Niece no empathy for her was mean to her through texts. She wanted to go to zoo event. Raechel Chute, her niece, not a good idea in airport all day and she had no empathy for that. Ladona Ridgel asked what did  she expect her to do with two days off. Tried to get her on the phone to talk to her. Tere is a lot of dysfunction between mom, sister and niece. Very aggressive controlling of patient a lot got away because toxic. 16 years niece not in life she is in her late twenties. Sister 4 years younger haven't talked to her 20 years. So much counseling for this not going back. Niece mean text didn't respond patient's family she explains likes to go back and forth arguing, patient stepped back from it let herself think about things process and be logical. Asked her if could talk on the phone conversation about how she treated her. Madison, patient's daughter, said can't believe talk to patient like this. Why patient having such a hard time with this. Talked to daughter and husband went backwards when this happened emotional for days in the past not this time, 1 day but told herself not going backwards not going to happen to her. Ladona Ridgel instead of apologizing pushed it back onto patient. Patient said let it go Ladona Ridgel didn't find the time to talk didn't feel comfortable staying with her didn't resolve the issues didn't realize how mean and rude she would be to patient. Another text slammed her again blamed patient for it that it was her anxiety all the things patient had told her she used against her thought could trust her. Thought God put in life doesn't have mother mean to people doesn't handle relationships patient took her off emotional ledge never thought Ladona Ridgel would do that to her showed her true colors. Patient family said don't have to put up with  this. Threw anxiety in her face not going to put up with that.  Went to [place] change plans best friend came and got her wonderful time didn't talk about this. Conclusion not relationship until apologized. Proud of herself her old self would have been upset about it for days. Told her nicely need to protect herself and what she did was mean. [facility] texted [facility]  triangulation problem with knee or in her head. She told her that patient fell three times on it and doesn't know what she has done to it. Patient pointed out Ladona Ridgel was seeing if she was lying to get out of zoo. Triangles with family not going to do that anymore. Mom and sister and niece were mean to her doesn't let people be mean to her anymore. So glad realize did the work that didn't go backwards.  On a positive note went to [place] by herself didn't spin. Tell herself if don't know what to do ask people. Felt so at ease got through check point. Knee brace didn't worry about that go with flow had to check it wasn't upset. Can do it maybe anxious because haven't done in awhile. Wasn't as bad as thought.   Not too nervous about the wedding. Now feeling with how good things went to use energy branch out and get photography class. Didn't do any unhealthy thinking styles did good with situation handled well.   Very good session patient shared handling different situations with good coping skills.  With her niece was not good to let her be mean to her called her out wanted to talk to her about it needs what and talk except text.  Patient did not respond she thought about it and thought about her reaction so her reaction was calm and en pointe.  Noted positive she is removing her cell from this relationship is a good coping noted handled the airport well, patient able to note no unhealthy thinking styles sees real progress managing her anxiety noted it builds on itself is well gaining mastery getting out of her comfort zone building skills mean she now is developing skills with her anxiety.  Will focus on self-esteem patient will get workbook we will look at any ongoing strategies that would be helpful for patient with her anxiety can look at [employer] which has some good basic skills for emotional regulation.  Suicidal/Homicidal: No  Plan: Return again in 2 weeks.2.  Patient get self-esteem  book therapist look at self-esteem worksheet look at [facility] challenge for core thoughts, look at ruminating thoughts from stop overthinking book look at diffusion, core beliefs from thoughts and feelings book  Diagnosis: Generalized anxiety disorder   Collaboration of Care: Other none needed  Patient/Guardian was advised Release of Information must be obtained prior to any record release in order to collaborate their care with an outside provider. Patient/Guardian was advised if they have not already done so to contact the registration department to sign all necessary forms in order for [place] to release information regarding their care.   Consent: Patient/Guardian gives verbal consent for treatment and assignment of benefits for services provided during this visit. Patient/Guardian expressed understanding and agreed to proceed.   Coolidge Breeze, LCSW 04/16/2023

## 2023-04-25 ENCOUNTER — Ambulatory Visit: Payer: No Typology Code available for payment source

## 2023-04-25 ENCOUNTER — Ambulatory Visit (INDEPENDENT_AMBULATORY_CARE_PROVIDER_SITE_OTHER): Payer: No Typology Code available for payment source | Admitting: Sports Medicine

## 2023-04-25 DIAGNOSIS — M222X1 Patellofemoral disorders, right knee: Secondary | ICD-10-CM | POA: Diagnosis not present

## 2023-04-25 DIAGNOSIS — M222X2 Patellofemoral disorders, left knee: Secondary | ICD-10-CM

## 2023-04-25 MED ORDER — IBUPROFEN 800 MG PO TABS
800.0000 mg | ORAL_TABLET | Freq: Three times a day (TID) | ORAL | 2 refills | Status: DC | PRN
Start: 2023-04-25 — End: 2023-09-11

## 2023-04-25 NOTE — Patient Instructions (Signed)
Look into McConnell taping

## 2023-04-25 NOTE — Assessment & Plan Note (Signed)
This is a very pleasant 52 year old female, she has long history of on and off knee pain, more recently she had some falls, historically she had x-rays and an MRI that were negative, she responded to conservative treatment. More recently she is having increasing pain medial parapatellar, worse with going up and down stairs. Topical diclofenac not sufficiently effective now.  Unable to take oral diclofenac but ibuprofen has done well in the past. On exam she does have some tenderness along the patellar facets but otherwise the knee exam is unremarkable. Hip abductors are weak bilaterally. We discussed the anatomy and pathophysiology. We will add oral ibuprofen for 2 weeks, formal physical therapy with a focus on the hip abductors as well as vastus medialis and I would like at least 1 session for teaching her how to do McConnell taping as opposed to regular KT taping. Return to see me in about 6 weeks, injection +/- MRI if not better.

## 2023-04-25 NOTE — Progress Notes (Signed)
    Procedures performed today:    None.  Independent interpretation of notes and tests performed by another provider:   None.  Brief History, Exam, Impression, and Recommendations:    Patellofemoral pain syndrome This is a very pleasant 52 year old female, she has long history of on and off knee pain, more recently she had some falls, historically she had x-rays and an MRI that were negative, she responded to conservative treatment. More recently she is having increasing pain medial parapatellar, worse with going up and down stairs. Topical diclofenac not sufficiently effective now.  Unable to take oral diclofenac but ibuprofen has done well in the past. On exam she does have some tenderness along the patellar facets but otherwise the knee exam is unremarkable. Hip abductors are weak bilaterally. We discussed the anatomy and pathophysiology. We will add oral ibuprofen for 2 weeks, formal physical therapy with a focus on the hip abductors as well as vastus medialis and I would like at least 1 session for teaching her how to do McConnell taping as opposed to regular KT taping. Return to see me in about 6 weeks, injection +/- MRI if not better.    ____________________________________________ Ihor Austin. Benjamin Stain, M.D., ABFM., CAQSM., AME. Primary Care and Sports Medicine [facility]  Adjunct Professor of Family Medicine  [employer] [employer] [facility] [employer] [employer] [facility] of Medicine  Restaurant manager, fast food

## 2023-04-30 ENCOUNTER — Ambulatory Visit (INDEPENDENT_AMBULATORY_CARE_PROVIDER_SITE_OTHER): Payer: No Typology Code available for payment source | Admitting: Licensed Clinical Social Worker

## 2023-04-30 DIAGNOSIS — F411 Generalized anxiety disorder: Secondary | ICD-10-CM

## 2023-04-30 NOTE — Progress Notes (Signed)
Virtual Visit via Video Note  I connected with Natalie Daniels on 04/30/23 at  2:00 PM EST by a video enabled telemedicine application and verified that I am speaking with the correct person using two identifiers.  Location: Patient: home Provider: home office   I discussed the limitations of evaluation and management by telemedicine and the availability of in person appointments. The patient expressed understanding and agreed to proceed.   I discussed the assessment and treatment plan with the patient. The patient was provided an opportunity to ask questions and all were answered. The patient agreed with the plan and demonstrated an understanding of the instructions.   The patient was advised to call back or seek an in-person evaluation if the symptoms worsen or if the condition fails to improve as anticipated.  I provided 40 minutes of non-face-to-face time during this encounter. THERAPIST PROGRESS NOTE  Session Time: 2:00 PM to 2:40 PM  Participation Level: Active  Behavioral Response: CasualAlertEuthymic  Type of Therapy: Individual Therapy  Treatment Goals addressed: Work on racing thoughts, patient describes she can spin and keep going on thing has to learn how to get out of that, work on balance, coping strategies for anxiety ProgressTowards Goals: Anxiety significantly improved does not let it stand in the way of her doing activities working on ruminating thoughts we will continue to work on self-esteem workbook-  Interventions: Solution Focused, Strength-based, Supportive, and Other: Self-esteem  Summary: Natalie Daniels is a 52 y.o. female who presents with her foot issues has orthotics, hurt knee. Knee problems fell on it three times. Wants her to do PT. Doesn't have much to talk about feels because of progress. . Feeling really good. Niece texted patient she didn't really apologize. Her niece said it was a disagreement doesn't want to go down path don't talk to each other  holidays coming patient won't be mean not the same amount of trust. Patient but you don't throw anxiety in her face and in daughter's face. Didn't justify to her don't need to but need to protect herself. Don't know if trust her. Be careful. Talking with [facility] before session not have the anxiety that has had ordered dress did measurements for sister-in-law. Not stressing over it.  Husband said to her tell her to hold to be in wetting therapist disagrees with this and so does patient she told her husband she asks her to be in wedding not matter older this is important for her and patient important to her not say no. Patient said getting older "it is what it is" and support her. Honored wants them. Spiritual gift assessment and patient said one of her gifts is to encourage people. She has built trustworthy relationships with Annice Pih. Patient is the one to reach out to people. Patient thinks one reason is he sister stopped letting her talk to niece and nephew and when Annice Pih have kids wanted to be involved want to go to activities make appearances.  Wants to reach out to lady did photography class know if classes for regular camera. Wants to take picture with regular camera try to do that again if she knows or willing to do a class with her. Thinking about that might do [employer] purse nervous doesn't know about serving but the questionnaire except one of her gifts exportation- encouraging people if down, 84 % also top three are serving and hospitality. Self-esteem book is a refresher patient sees herself in a different stage different season. This season [place] doing her thing, they are  invited to certain things [name] is going to move out last Christmas she will be living in house. Next season for patient what she wants to focus on big thing spiritual get in church and get involved. Husband doesn't want to do it but patient says has to make decision and just do it.  Getting about as a gift blowing up a picture of  [place] other thoughts include join a bird club.  Niece and nephew go to birthday part never miss birthday parties patient is photography of family, she is the family poparazzi, Questionnaires mostly in middle for self-esteem total score 76 she and therapist does not think it is too bad laughing at herself and respect herself at 10. Generally satisfied as a person still growing different seasons. Rather be her than somebody else 8 likes some of her qualities. Does have a bestfriend so creative compares herself found lacking not as creative as she is unaware not helpful to compare. Connected at a level not do a lot as adults. Knew daughters would stay connected.  [employer] a commemorate shin daughter 1 top prize patient told herself going no matter what step out can do it. She submitted essay as part of application. She was runner Database administrator, written up in local paper. Recognized took pictures patient able to get pictures without Erling Cruz mascot that the family loves.  Patient describes really good things happening further family.  Reviewed progress patient is doing a lot better with anxiety ever since [place] trip she verbalizes things such as making up her mind to do it even if there is no one to do with her, going to places and not letting her anxiety get in the way talking herself down reminding herself not a big deal all good coping strategies.  She will continue to work on self-esteem workbook she scored 43 which is high but patient also states she can still continue to work on it therapist noted better with her self-critical voice even though she is a lot better to continue to strengthen that.  Patient relates one of her guess is her spirituality and therapist noted this is core to helping her with growth giving her gifts.  Encouraged her other ways patient recognizes a new season asked herself what she wants to do and she has identified working on her faith, taking a camera class so very much engaged  in growth and in pursuing her interest in hobbies.  Patient will discontinue therapy will call if she feels need for additional sessions.            Suicidal/Homicidal: No  Plan: 1.discontinue therapy patient has made good progress work on this self-esteem book therapist provided worksheet from college he today on self-esteem also [facility] books. 2.  Patient will call if more appointments are needed   Diagnosis: Generalized anxiety disorder  Collaboration of Care: Other none needed  Patient/Guardian was advised Release of Information must be obtained prior to any record release in order to collaborate their care with an outside provider. Patient/Guardian was advised if they have not already done so to contact the registration department to sign all necessary forms in order for [place] to release information regarding their care.   Consent: Patient/Guardian gives verbal consent for treatment and assignment of benefits for services provided during this visit. Patient/Guardian expressed understanding and agreed to proceed.   Coolidge Breeze, LCSW 04/30/2023

## 2023-05-07 ENCOUNTER — Ambulatory Visit
Payer: No Typology Code available for payment source | Attending: Sports Medicine | Admitting: Rehabilitative and Restorative Service Providers"

## 2023-05-07 ENCOUNTER — Encounter: Payer: Self-pay | Admitting: Rehabilitative and Restorative Service Providers"

## 2023-05-07 ENCOUNTER — Other Ambulatory Visit: Payer: Self-pay

## 2023-05-07 DIAGNOSIS — M6281 Muscle weakness (generalized): Secondary | ICD-10-CM | POA: Insufficient documentation

## 2023-05-07 DIAGNOSIS — R29898 Other symptoms and signs involving the musculoskeletal system: Secondary | ICD-10-CM | POA: Insufficient documentation

## 2023-05-07 DIAGNOSIS — R2689 Other abnormalities of gait and mobility: Secondary | ICD-10-CM | POA: Diagnosis not present

## 2023-05-07 DIAGNOSIS — M222X1 Patellofemoral disorders, right knee: Secondary | ICD-10-CM | POA: Diagnosis present

## 2023-05-07 NOTE — Therapy (Signed)
OUTPATIENT PHYSICAL THERAPY LOWER EXTREMITY EVALUATION   Patient Name: Natalie Daniels MRN: [id number] DOB:Dec 05, 1970, 52 y.o., female Today's Date: 05/07/2023  END OF SESSION:  PT End of Session - 05/07/23 1231     Visit Number 1    Number of Visits 16    Date for PT Re-Evaluation 07/02/23    Authorization Type aetna    PT Start Time 1100    PT Stop Time 1154    PT Time Calculation (min) 54 min    Activity Tolerance Patient tolerated treatment well             Past Medical History:  Diagnosis Date   Anxiety    Constipation    Headache    Plantar fasciitis    Rosacea    Past Surgical History:  Procedure Laterality Date   ANTERIOR FUSION LUMBAR SPINE     BACK SURGERY     CESAREAN SECTION     COLONOSCOPY W/ POLYPECTOMY     age 45   Patient Active Problem List   Diagnosis Date Noted   Hemorrhoids 06/26/2022   Tinea corporis 02/13/2022   Primary osteoarthritis of first carpometacarpal joint of one hand, left 07/23/2021   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 08/08/2020   Vitamin D deficiency 04/27/2019   Anxiety 04/27/2019   Other insomnia 04/27/2019   Migraine without aura and without status migrainosus, not intractable 04/27/2019   Post-viral cough syndrome 08/10/2018   Acute costochondritis 08/10/2018   PND (post-nasal drip) 08/10/2018   Patellofemoral pain syndrome 07/29/2017   Chronic pain of right knee 07/03/2017   Former smoker, stopped smoking many years ago 07/03/2017   Allergic rhinitis 11/17/2015   History of motion sickness 11/17/2015   Vertigo 11/17/2015    PCP: Christen Butter, NP  REFERRING PROVIDER: Dr Salomon Fick  REFERRING DIAG: R patellofemoral pain syndrome   THERAPY DIAG:  Patellofemoral syndrome of right knee  Other symptoms and signs involving the musculoskeletal system  Muscle weakness (generalized)  Other abnormalities of gait and mobility  Rationale for Evaluation and  Treatment: Rehabilitation  ONSET DATE: 03/28/23  SUBJECTIVE:   SUBJECTIVE STATEMENT: Stepped down and felt a sharp pain in the R knee. Pain has persisted since that time. Antiinflammatory meds have helped some. She still has pain with going up and down stairs or when she is outside. She has pain with sitting with knee bent > 5-10 min. R knee pain intermittently since 2019 following a fall. She has been doing exercises when it flares up and symptoms improve however the pain increased with exercises after fall 03/28/23.  PERTINENT HISTORY: history of R knee pain; bilat hand pain; anxiety; arthritis  PAIN:  Are you having pain? Yes: NPRS scale: 2/10; worst 8/10 with yard work  Pain location: R knee through quad and medial quad area  Pain description: stabbing with yard work; throbbing at night  Aggravating factors: squatting; stairs; sitting with knee bent; walking  Relieving factors: meds; elevation; ice  PRECAUTIONS: None  RED FLAGS: None   WEIGHT BEARING RESTRICTIONS: No  FALLS:  Has patient fallen in last 6 months? 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  DIAGNOSTIC FINDINGS: xrays 04/25/23 results not available   PATIENT SURVEYS:  FOTO 47; goal 63  COGNITION: Overall cognitive status: Within functional limits for tasks assessed     SENSATION: WFL  EDEMA:  Minimal edema medial knee   MUSCLE LENGTH: Hamstrings: Right 65 deg; Left 60 deg   POSTURE: rounded shoulders, forward head, flexed trunk , and weight shift  left  PALPATION: Tenderness R medial patella with lateral tracking with knee extension  Tenderness to palpation through the lateral quad to insertion of lateral quad in superior lateral patella   LOWER EXTREMITY ROM:  Active ROM Right eval Left eval  Hip flexion    Hip extension    Hip abduction    Hip adduction    Hip internal rotation    Hip external rotation    Knee flexion 130 130  Knee extension -10 0  Ankle dorsiflexion    Ankle plantarflexion    Ankle inversion    Ankle eversion     (Blank rows = not tested)  LOWER EXTREMITY MMT:  MMT Right eval Left eval  Hip flexion    Hip extension 4   Hip abduction 4   Hip adduction    Hip internal rotation    Hip external rotation    Knee flexion 4   Knee extension 4+   Ankle dorsiflexion    Ankle plantarflexion    Ankle inversion    Ankle eversion     (Blank rows = not tested)  LOWER EXTREMITY SPECIAL TESTS:  Knee special tests: Patellafemoral apprehension test: positive , Patellafemoral grind test: positive , and Step up/down test: positive   FUNCTIONAL TESTS:  Unable to perform SLS R LE   GAIT: Distance walked: 40 ft  Assistive device utilized: None Level of assistance: Complete Independence Comments: antalgic gait with decreased wt bearing R with stance R; R LE in ER    [facility] Adult PT Treatment:                                                DATE: 05/07/23 Therapeutic Exercise: Supine  Quad set thin pillow under knee 5 sec x 5 SLR in ER 5 sec x 5  Hamstring stretch with strap 30 sec x 1 ITB stretch with strap 30 sec x 1  Trial of hip abduction in sidelying - patient using lateral quads and ITB - will add later to isolate hip abductors  Manual Therapy: STM lateral quad to lateral patellar area  IASTM lateral quad  Transverse friction massage lateral patellar tendon at superior lateral patella   Taping  McConnell taping to correct lateral tracking patella  Modalities: Ice at home  Self  Care: Education re-importance of ongoing strengthening for R LE    PATIENT EDUCATION:  Education details: POC; HEP  Person educated: Patient Education method: Programmer, multimedia, Facilities manager, Actor cues, Verbal cues, and Handouts Education comprehension: verbalized understanding, returned demonstration, verbal cues required, tactile cues required, and needs further education  HOME EXERCISE PROGRAM: Access Code: [email or web address] URL: [email or web address] Date: 05/07/2023 Prepared by: Corlis Leak  Exercises - Supine Quad Set  - 2 x daily - 7 x weekly - 1 sets - 10 reps - 3 sec  hold - Straight Leg Raise with External Rotation  - 2  x daily - 7 x weekly - 1 sets - 10 reps - 3-5 sec  hold - Hooklying Hamstring Stretch with Strap  - 2 x daily - 7 x weekly - 1 sets - 3 reps - 30 sec  hold - Supine ITB Stretch with Strap  - 2 x daily - 7 x weekly - 1 sets - 3 reps - 30 sec  hold  ASSESSMENT:  CLINICAL IMPRESSION: Patient is a 52 y.o. female who was seen today for physical therapy evaluation and treatment for R patellofemoral syndrome. Patient had initial injury to R nee in 2019 and has experienced some periodic flare ups since that time. Recent flare up occurred 03/28/23 when patient injured R knee stepping down from a step to the back yard. Pain has persisted since that time. Patient has decreased R knee ROM; decreased strength R LE; antalgic gait; muscular tightness R quad/patellar area; lateral tracking patella; pain with functional activities. Patient will benefit from PT to address problems identified.    OBJECTIVE IMPAIRMENTS: Abnormal gait, decreased activity tolerance, decreased balance, decreased endurance, decreased mobility, difficulty walking, decreased ROM, decreased strength, increased fascial restrictions, increased muscle spasms, impaired flexibility, improper body mechanics, postural dysfunction, and pain.   ACTIVITY LIMITATIONS: carrying, lifting, bending, sitting,  standing, squatting, stairs, transfers, and locomotion level  PARTICIPATION LIMITATIONS: meal prep, cleaning, laundry, driving, shopping, community activity, occupation, and yard work  PERSONAL FACTORS: Fitness, Past/current experiences, Profession, and Time since onset of injury/illness/exacerbation are also affecting patient's functional outcome.   REHAB POTENTIAL: Good  CLINICAL DECISION MAKING: Stable/uncomplicated  EVALUATION COMPLEXITY: Low   GOALS: Goals reviewed with patient? Yes  SHORT TERM GOALS: Target date: 06/04/2023  Independent initial HEP  Baseline: Goal status: INITIAL  2.  Decrease pain R knee by 25-50% allowing patient to progress with exercises and functional activities  Baseline:  Goal status: INITIAL  3.  Improve FOTO to 55 Baseline: 47 Goal status: INITIAL   LONG TERM GOALS: Target date: 07/02/2023   Full pain free ROM R knee  Baseline:  Goal status: INITIAL  2.  5/5 strength R LE  Baseline:  Goal status: INITIAL  3.  Normal gait pattern level surfaces and stairs  Baseline:  Goal status: INITIAL  4.  Ability to sit for 30-60 min without increased R knee pain  Baseline:  Goal status: INITIAL  5.  Independent in advanced HEP  Baseline:  Goal status: INITIAL  6.  Improve FOTO to 63 Baseline:  Goal status: INITIAL   PLAN:  PT FREQUENCY: 1-2x/week  PT DURATION: 8 weeks  PLANNED INTERVENTIONS: 97164- PT Re-evaluation, 97110-Therapeutic exercises, 97530- Therapeutic activity, 97112- Neuromuscular re-education, 97535- Self Care, [id number]- Manual therapy, [id number]- Gait training, [phone]- Aquatic Therapy, 97014- Electrical stimulation (unattended), 97035- Ultrasound, 82956- Ionotophoresis 4mg /ml Dexamethasone, Patient/Family education, Balance training, Stair training, Taping, Dry Needling, Joint mobilization, Cryotherapy, and Moist heat  PLAN FOR NEXT SESSION: review and progress exercises; McConnell taping R knee to correct patellar alignment;  manual work, DN, modalities as indicated    [employer], PT 05/07/2023, 12:32 PM

## 2023-05-08 DIAGNOSIS — M722 Plantar fascial fibromatosis: Secondary | ICD-10-CM

## 2023-05-11 ENCOUNTER — Other Ambulatory Visit: Payer: Self-pay | Admitting: Medical-Surgical

## 2023-05-13 ENCOUNTER — Encounter: Payer: Self-pay | Admitting: Sports Medicine

## 2023-05-13 ENCOUNTER — Encounter: Payer: Self-pay | Admitting: Rehabilitative and Restorative Service Providers"

## 2023-05-13 ENCOUNTER — Ambulatory Visit: Payer: No Typology Code available for payment source | Admitting: Rehabilitative and Restorative Service Providers"

## 2023-05-13 DIAGNOSIS — M222X1 Patellofemoral disorders, right knee: Secondary | ICD-10-CM

## 2023-05-13 DIAGNOSIS — R29898 Other symptoms and signs involving the musculoskeletal system: Secondary | ICD-10-CM

## 2023-05-13 DIAGNOSIS — M6281 Muscle weakness (generalized): Secondary | ICD-10-CM

## 2023-05-13 DIAGNOSIS — R2689 Other abnormalities of gait and mobility: Secondary | ICD-10-CM

## 2023-05-13 NOTE — Therapy (Signed)
OUTPATIENT PHYSICAL THERAPY LOWER EXTREMITY TREATMENT   Patient Name: Natalie Daniels MRN: [id number] DOB:1970/07/16, 52 y.o., female Today's Date: 05/13/2023  END OF SESSION:  PT End of Session - 05/13/23 1535     Visit Number 2    Number of Visits 16    Date for PT Re-Evaluation 07/02/23    Authorization Type aetna    PT Start Time 1533    PT Stop Time 1620    PT Time Calculation (min) 47 min    Activity Tolerance Patient tolerated treatment well             Past Medical History:  Diagnosis Date   Anxiety    Constipation    Headache    Plantar fasciitis    Rosacea    Past Surgical History:  Procedure Laterality Date   ANTERIOR FUSION LUMBAR SPINE     BACK SURGERY     CESAREAN SECTION     COLONOSCOPY W/ POLYPECTOMY     age 34   Patient Active Problem List   Diagnosis Date Noted   Hemorrhoids 06/26/2022   Tinea corporis 02/13/2022   Primary osteoarthritis of first carpometacarpal joint of one hand, left 07/23/2021   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 08/08/2020   Vitamin D deficiency 04/27/2019   Anxiety 04/27/2019   Other insomnia 04/27/2019   Migraine without aura and without status migrainosus, not intractable 04/27/2019   Post-viral cough syndrome 08/10/2018   Acute costochondritis 08/10/2018   PND (post-nasal drip) 08/10/2018   Patellofemoral pain syndrome 07/29/2017   Chronic pain of right knee 07/03/2017   Former smoker, stopped smoking many years ago 07/03/2017   Allergic rhinitis 11/17/2015   History of motion sickness 11/17/2015   Vertigo 11/17/2015    PCP: Christen Butter, NP  REFERRING PROVIDER: Dr Salomon Fick  REFERRING DIAG: R patellofemoral pain syndrome   THERAPY DIAG:  Patellofemoral syndrome of right knee  Other symptoms and signs involving the musculoskeletal system  Muscle weakness (generalized)  Other abnormalities of gait and mobility  Rationale for Evaluation and  Treatment: Rehabilitation  ONSET DATE: 03/28/23  SUBJECTIVE:   SUBJECTIVE STATEMENT: Patient reports that she feels that the knee is a little better but she still has pain in the R knee. She will have a "zinger" every once in a while. She has been working on her exercises and doing the massage on the side of her knee.   EVAL: Stepped down and felt a sharp pain in the R knee. Pain has persisted since that time. Antiinflammatory meds have helped some. She still has pain with going up and down stairs or when she is outside. She has pain with sitting with knee bent > 5-10 min. R knee pain intermittently since 2019 following a fall. She has been doing exercises when it flares up and symptoms improve however the pain increased with exercises after fall 03/28/23.  PERTINENT HISTORY: history of R knee pain; bilat hand pain; anxiety; arthritis  PAIN:  Are you having pain? Yes: NPRS scale: 3/10; worst 8/10 with yard work  Pain location: R knee through quad and medial quad area  Pain description: stabbing with yard work; throbbing at night  Aggravating factors: squatting; stairs; sitting with knee bent; walking  Relieving factors: meds; elevation; ice  PRECAUTIONS: None  WEIGHT BEARING RESTRICTIONS: No  FALLS:  Has patient fallen in last 6 months? 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  DIAGNOSTIC FINDINGS: xrays 04/25/23 results not available   PATIENT SURVEYS:  FOTO 47; goal 63  EDEMA:  Minimal edema medial knee   MUSCLE LENGTH: Hamstrings: Right 65 deg; Left 60  deg   POSTURE: rounded shoulders, forward head, flexed trunk , and weight shift left  PALPATION: Tenderness R medial patella with lateral tracking with knee extension  Tenderness to palpation through the lateral quad to insertion of lateral quad in superior lateral patella   LOWER EXTREMITY ROM:  Active ROM Right eval Left eval  Hip flexion    Hip extension    Hip abduction    Hip adduction    Hip internal rotation    Hip external rotation    Knee flexion 130 130  Knee extension -10 0  Ankle dorsiflexion    Ankle plantarflexion    Ankle inversion    Ankle eversion     (Blank rows = not tested)  LOWER EXTREMITY MMT:  MMT Right eval Left eval  Hip flexion    Hip extension 4   Hip abduction 4   Hip adduction    Hip internal rotation    Hip external rotation    Knee flexion 4   Knee extension 4+   Ankle dorsiflexion    Ankle plantarflexion    Ankle inversion    Ankle eversion     (Blank rows = not tested)  LOWER EXTREMITY SPECIAL TESTS:  Knee special tests: Patellafemoral apprehension test: positive , Patellafemoral grind test: positive , and Step up/down test: positive   FUNCTIONAL TESTS:  Unable to perform SLS R LE   GAIT: Distance walked: 40 ft  Assistive device utilized: None Level of assistance: Complete Independence Comments: antalgic gait with decreased wt bearing R with stance R; R LE in ER    [facility] Adult PT Treatment:                                                DATE: 05/13/23 Therapeutic Exercise: Nustep L 6 -> L4 x 5 min  Supine  Quad set thin pillow under knee 5 sec x 5 SLR in ER 5 sec x 5  Hamstring stretch with strap 30 sec x 1 ITB stretch with strap 30 sec x 1  Hip adductor in hooklying ball btn knees 5 sec x 10  Standing  Wall squat ball btn knees 10 sec x  5  SLS 20-30 sec x 3 R/L   Trial of hip abduction in sidelying - patient using lateral quads and ITB - will add later to isolate hip abductors  Manual Therapy: STM lateral  quad to lateral patellar area  IASTM lateral quad  Transverse friction massage lateral patellar tendon at superior lateral patella   Modalities:  Trial of TENS unit R knee x 5 min  Ice at home  Self Care: Education re-importance of ongoing strengthening for R LE  [facility] Adult PT Treatment:  DATE: 05/07/23 Therapeutic Exercise: Supine  Quad set thin pillow under knee 5 sec x 5 SLR in ER 5 sec x 5  Hamstring stretch with strap 30 sec x 1 ITB stretch with strap 30 sec x 1  Trial of hip abduction in sidelying - patient using lateral quads and ITB - will add later to isolate hip abductors  Manual Therapy: STM lateral quad to lateral patellar area  IASTM lateral quad  Transverse friction massage lateral patellar tendon at superior lateral patella   Taping  McConnell taping to correct lateral tracking patella  Modalities: Ice at home  Self Care: Education re-importance of ongoing strengthening for R LE    PATIENT EDUCATION:  Education details: POC; HEP  Person educated: Patient Education method: Programmer, multimedia, Facilities manager, Actor cues, Verbal cues, and Handouts Education comprehension: verbalized understanding, returned demonstration, verbal cues required, tactile cues required, and needs further education  HOME EXERCISE PROGRAM: Access Code: [email or web address] URL: [email or web address] Date: 05/07/2023 Prepared by: Corlis Leak  Exercises - Supine Quad Set  - 2 x daily - 7 x weekly - 1 sets - 10 reps - 3 sec  hold - Straight Leg Raise with External Rotation  - 2 x daily - 7 x weekly - 1 sets - 10 reps - 3-5 sec  hold - Hooklying Hamstring Stretch with Strap  - 2 x daily - 7 x weekly - 1 sets - 3 reps - 30 sec  hold - Supine ITB Stretch with Strap  - 2 x daily - 7 x weekly - 1 sets - 3 reps - 30 sec  hold  ASSESSMENT:  CLINICAL IMPRESSION: Good response to initial exercises with decreased pain R knee. Patient is consistent  with HEP and massage for lateral patella and lateral patellar tendon. Progressed with medial quad strengthening. Trial of TENS unit R knee - patient has a TENs unit at home and would like to use that for R knee pain which helped in the past.   EVAL: Patient is a 52 y.o. female who was seen today for physical therapy evaluation and treatment for R patellofemoral syndrome. Patient had initial injury to R nee in 2019 and has experienced some periodic flare ups since that time. Recent flare up occurred 03/28/23 when patient injured R knee stepping down from a step to the back yard. Pain has persisted since that time. Patient has decreased R knee ROM; decreased strength R LE; antalgic gait; muscular tightness R quad/patellar area; lateral tracking patella; pain with functional activities. Patient will benefit from PT to address problems identified.    OBJECTIVE IMPAIRMENTS: Abnormal gait, decreased activity tolerance, decreased balance, decreased endurance, decreased mobility, difficulty walking, decreased ROM, decreased strength, increased fascial restrictions, increased muscle spasms, impaired flexibility, improper body mechanics, postural dysfunction, and pain.    GOALS: Goals reviewed with patient? Yes  SHORT TERM GOALS: Target date: 06/04/2023  Independent initial HEP  Baseline: Goal status: INITIAL  2.  Decrease pain R knee by 25-50% allowing patient to progress with exercises and functional activities  Baseline:  Goal status: INITIAL  3.  Improve FOTO to 55 Baseline: 47 Goal status: INITIAL   LONG TERM GOALS: Target date: 07/02/2023   Full pain free ROM R knee  Baseline:  Goal status: INITIAL  2.  5/5 strength R LE  Baseline:  Goal status: INITIAL  3.  Normal gait pattern level surfaces and stairs  Baseline:  Goal status: INITIAL  4.  Ability to sit for 30-60 min without increased R knee  pain  Baseline:  Goal status: INITIAL  5.  Independent in advanced HEP  Baseline:   Goal status: INITIAL  6.  Improve FOTO to 63 Baseline:  Goal status: INITIAL   PLAN:  PT FREQUENCY: 1-2x/week  PT DURATION: 8 weeks  PLANNED INTERVENTIONS: 97164- PT Re-evaluation, 97110-Therapeutic exercises, 97530- Therapeutic activity, 97112- Neuromuscular re-education, 97535- Self Care, 16109- Manual therapy, [id number]- Gait training, [phone]- Aquatic Therapy, 97014- Electrical stimulation (unattended), 97035- Ultrasound, 09811- Ionotophoresis 4mg /ml Dexamethasone, Patient/Family education, Balance training, Stair training, Taping, Dry Needling, Joint mobilization, Cryotherapy, and Moist heat  PLAN FOR NEXT SESSION: review and progress exercises; McConnell taping R knee to correct patellar alignment; manual work, DN, modalities as indicated    [employer], PT 05/13/2023, 3:35 PM

## 2023-05-14 ENCOUNTER — Ambulatory Visit (HOSPITAL_COMMUNITY): Payer: No Typology Code available for payment source | Admitting: Licensed Clinical Social Worker

## 2023-05-21 ENCOUNTER — Ambulatory Visit: Payer: No Typology Code available for payment source | Admitting: Rehabilitative and Restorative Service Providers"

## 2023-05-28 ENCOUNTER — Encounter: Payer: Self-pay | Admitting: Physical Therapy

## 2023-05-28 ENCOUNTER — Ambulatory Visit: Payer: No Typology Code available for payment source | Attending: Sports Medicine | Admitting: Physical Therapy

## 2023-05-28 DIAGNOSIS — M6281 Muscle weakness (generalized): Secondary | ICD-10-CM | POA: Insufficient documentation

## 2023-05-28 DIAGNOSIS — R29898 Other symptoms and signs involving the musculoskeletal system: Secondary | ICD-10-CM | POA: Diagnosis present

## 2023-05-28 DIAGNOSIS — M222X1 Patellofemoral disorders, right knee: Secondary | ICD-10-CM | POA: Diagnosis present

## 2023-05-28 DIAGNOSIS — R2689 Other abnormalities of gait and mobility: Secondary | ICD-10-CM | POA: Insufficient documentation

## 2023-05-28 NOTE — Therapy (Signed)
OUTPATIENT PHYSICAL THERAPY LOWER EXTREMITY TREATMENT   Patient Name: Natalie Daniels MRN: [id number] DOB:12/10/1970, 52 y.o., female Today's Date: 05/28/2023  END OF SESSION:  PT End of Session - 05/28/23 1442     Visit Number 3    Number of Visits 16    Date for PT Re-Evaluation 07/02/23    Authorization Type aetna    PT Start Time 1400    PT Stop Time 1442    PT Time Calculation (min) 42 min    Activity Tolerance Patient tolerated treatment well    Behavior During Therapy WFL for tasks assessed/performed              Past Medical History:  Diagnosis Date   Anxiety    Constipation    Headache    Plantar fasciitis    Rosacea    Past Surgical History:  Procedure Laterality Date   ANTERIOR FUSION LUMBAR SPINE     BACK SURGERY     CESAREAN SECTION     COLONOSCOPY W/ POLYPECTOMY     age 57   Patient Active Problem List   Diagnosis Date Noted   Hemorrhoids 06/26/2022   Tinea corporis 02/13/2022   Primary osteoarthritis of first carpometacarpal joint of one hand, left 07/23/2021   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 08/08/2020   Vitamin D deficiency 04/27/2019   Anxiety 04/27/2019   Other insomnia 04/27/2019   Migraine without aura and without status migrainosus, not intractable 04/27/2019   Post-viral cough syndrome 08/10/2018   Acute costochondritis 08/10/2018   PND (post-nasal drip) 08/10/2018   Patellofemoral pain syndrome 07/29/2017   Chronic pain of right knee 07/03/2017   Former smoker, stopped smoking many years ago 07/03/2017   Allergic rhinitis 11/17/2015   History of motion sickness 11/17/2015   Vertigo 11/17/2015    PCP: Christen Butter, NP  REFERRING PROVIDER: Dr Salomon Fick  REFERRING DIAG: R patellofemoral pain syndrome   THERAPY DIAG:  Patellofemoral syndrome of right knee  Other symptoms and signs involving the musculoskeletal system  Muscle weakness (generalized)  Other  abnormalities of gait and mobility  Rationale for Evaluation and Treatment: Rehabilitation  ONSET DATE: 03/28/23  SUBJECTIVE:   SUBJECTIVE STATEMENT: Pt states "my knee has been really good this week". She states she is sore by the end of the exercises, but was able to work in the garage without too much pain  EVAL: Stepped down and felt a sharp pain in the R knee. Pain has persisted since that time. Antiinflammatory meds have helped some. She still has pain with going up and down stairs or when she is outside. She has pain with sitting with knee bent > 5-10 min. R knee pain intermittently since 2019 following a fall. She has been doing exercises when it flares up and symptoms improve however the pain increased with exercises after fall 03/28/23.  PERTINENT HISTORY: history of R knee pain; bilat hand pain; anxiety; arthritis  PAIN:  Are you having pain? Yes: NPRS scale: 1/10; worst 8/10 with yard work  Pain location: R knee through quad and medial quad area  Pain description: stabbing with yard work; throbbing at night  Aggravating factors: squatting; stairs; sitting with knee bent; walking  Relieving factors: meds; elevation; ice  PRECAUTIONS: None  WEIGHT BEARING RESTRICTIONS: No  FALLS:  Has patient fallen in last 6 months? 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  DIAGNOSTIC FINDINGS: xrays 04/25/23 results not available   PATIENT SURVEYS:  FOTO 47; goal 63  EDEMA:  Minimal edema medial knee   MUSCLE LENGTH: Hamstrings: Right 65 deg; Left 60 deg   POSTURE:  rounded shoulders, forward head, flexed trunk , and weight shift left  PALPATION: Tenderness R medial patella with lateral tracking with knee extension  Tenderness to palpation through the lateral quad to insertion of lateral quad in superior lateral patella   LOWER EXTREMITY ROM:  Active ROM Right eval Left eval  Hip flexion    Hip extension    Hip abduction    Hip adduction    Hip internal rotation    Hip external rotation    Knee flexion 130 130  Knee extension -10 0  Ankle dorsiflexion    Ankle plantarflexion    Ankle inversion    Ankle eversion     (Blank rows = not tested)  LOWER EXTREMITY MMT:  MMT Right eval Left eval  Hip flexion    Hip extension 4   Hip abduction 4   Hip adduction    Hip internal rotation    Hip external rotation    Knee flexion 4   Knee extension 4+   Ankle dorsiflexion    Ankle plantarflexion    Ankle inversion    Ankle eversion     (Blank rows = not tested)  LOWER EXTREMITY SPECIAL TESTS:  Knee special tests: Patellafemoral apprehension test: positive , Patellafemoral grind test: positive , and Step up/down test: positive   FUNCTIONAL TESTS:  Unable to perform SLS R LE   GAIT: Distance walked: 40 ft  Assistive device utilized: None Level of assistance: Complete Independence Comments: antalgic gait with decreased wt bearing R with stance R; R LE in ER    [facility] Adult PT Treatment:                                                DATE: 05/28/23 Therapeutic Exercise: Nustep LEs only L6 x 5 min Lateral step ups 4'' step x 10, 6'' step x 10 Captain morgans x 10 Side step green TB 15' x 4 SLR with ER 2 x 10 Bridge with ball squeeze 10 x 5 sec hold Sidelying clam with ball between ankles x 20 Sidelying hip abd 2 x 10 Squat touch 2 x 10 SLS 3 x 30 sec   [facility] Adult PT Treatment:                                                DATE: 05/13/23 Therapeutic Exercise: Nustep L 6 -> L4 x 5 min  Supine  Quad set thin pillow under knee  5 sec x 5 SLR in ER 5 sec x 5  Hamstring stretch with strap 30 sec x 1 ITB stretch with strap 30 sec x 1  Hip adductor in hooklying ball btn knees  5 sec x 10  Standing  Wall squat ball btn knees 10 sec x  5  SLS 20-30 sec x 3 R/L   Trial of hip abduction in sidelying - patient using lateral quads and ITB - will add later to isolate hip abductors  Manual Therapy: STM lateral quad to lateral patellar area  IASTM lateral quad  Transverse friction massage lateral patellar tendon at superior lateral patella   Modalities:  Trial of TENS unit R knee x 5 min  Ice at home  Self Care: Education re-importance of ongoing strengthening for R LE  [facility] Adult PT Treatment:                                                DATE: 05/07/23 Therapeutic Exercise: Supine  Quad set thin pillow under knee 5 sec x 5 SLR in ER 5 sec x 5  Hamstring stretch with strap 30 sec x 1 ITB stretch with strap 30 sec x 1  Trial of hip abduction in sidelying - patient using lateral quads and ITB - will add later to isolate hip abductors  Manual Therapy: STM lateral quad to lateral patellar area  IASTM lateral quad  Transverse friction massage lateral patellar tendon at superior lateral patella   Taping  McConnell taping to correct lateral tracking patella  Modalities: Ice at home  Self Care: Education re-importance of ongoing strengthening for R LE    PATIENT EDUCATION:  Education details: POC; HEP  Person educated: Patient Education method: Programmer, multimedia, Facilities manager, Actor cues, Verbal cues, and Handouts Education comprehension: verbalized understanding, returned demonstration, verbal cues required, tactile cues required, and needs further education  HOME EXERCISE PROGRAM: Access Code: [email or web address] URL: [email or web address] Date: 05/28/2023 Prepared by: Reggy Eye  Exercises - Supine Quad Set  - 2 x daily - 7 x weekly - 1 sets - 10 reps - 3 sec  hold - Straight Leg Raise with  External Rotation  - 2 x daily - 7 x weekly - 1 sets - 10 reps - 3-5 sec  hold - Hooklying Hamstring Stretch with Strap  - 2 x daily - 7 x weekly - 1 sets - 3 reps - 30 sec  hold - Supine Bridge with Mini Swiss Ball Between Knees  - 1 x daily - 7 x weekly - 1-2 sets - 10 reps - Clam  - 1 x daily - 7 x weekly - 3 sets - 10 reps - Single Leg Stance with Support  - 2 x daily - 7 x weekly - 1 sets - 3 reps - 30 sec  hold - Squat with Chair Touch  - 1 x daily - 7 x weekly - 3 sets - 10 reps  ASSESSMENT:  CLINICAL IMPRESSION: Pt with good tolerance to progression of exercise today. Added more in standing to improve functional mobility. Updated and condensed HEP. Pt returns to MD after next visit  EVAL: Patient is a 52 y.o. female who was seen today for physical therapy evaluation and treatment for R patellofemoral syndrome. Patient had initial injury to R nee in 2019 and has experienced some periodic flare ups since that time. Recent flare up occurred 03/28/23 when patient injured R knee stepping down from a step to the back yard. Pain has persisted since that time. Patient has decreased R knee ROM; decreased strength R LE; antalgic gait; muscular  tightness R quad/patellar area; lateral tracking patella; pain with functional activities. Patient will benefit from PT to address problems identified.    OBJECTIVE IMPAIRMENTS: Abnormal gait, decreased activity tolerance, decreased balance, decreased endurance, decreased mobility, difficulty walking, decreased ROM, decreased strength, increased fascial restrictions, increased muscle spasms, impaired flexibility, improper body mechanics, postural dysfunction, and pain.    GOALS: Goals reviewed with patient? Yes  SHORT TERM GOALS: Target date: 06/04/2023  Independent initial HEP  Baseline: Goal status: INITIAL  2.  Decrease pain R knee by 25-50% allowing patient to progress with exercises and functional activities  Baseline:  Goal status: INITIAL  3.   Improve FOTO to 55 Baseline: 47 Goal status: INITIAL   LONG TERM GOALS: Target date: 07/02/2023   Full pain free ROM R knee  Baseline:  Goal status: INITIAL  2.  5/5 strength R LE  Baseline:  Goal status: INITIAL  3.  Normal gait pattern level surfaces and stairs  Baseline:  Goal status: INITIAL  4.  Ability to sit for 30-60 min without increased R knee pain  Baseline:  Goal status: INITIAL  5.  Independent in advanced HEP  Baseline:  Goal status: INITIAL  6.  Improve FOTO to 63 Baseline:  Goal status: INITIAL   PLAN:  PT FREQUENCY: 1-2x/week  PT DURATION: 8 weeks  PLANNED INTERVENTIONS: 97164- PT Re-evaluation, 97110-Therapeutic exercises, 97530- Therapeutic activity, 97112- Neuromuscular re-education, 97535- Self Care, 65784- Manual therapy, [id number]- Gait training, [phone]- Aquatic Therapy, 97014- Electrical stimulation (unattended), 97035- Ultrasound, [id number]- Ionotophoresis 4mg /ml Dexamethasone, Patient/Family education, Balance training, Stair training, Taping, Dry Needling, Joint mobilization, Cryotherapy, and Moist heat  PLAN FOR NEXT SESSION:CHECK GOALS, knee strength, hip abductor strength   Jerrian Mells, PT 05/28/2023, 2:42 PM

## 2023-06-04 ENCOUNTER — Ambulatory Visit: Payer: No Typology Code available for payment source | Admitting: Physical Therapy

## 2023-06-04 ENCOUNTER — Encounter: Payer: Self-pay | Admitting: Physical Therapy

## 2023-06-04 DIAGNOSIS — M222X1 Patellofemoral disorders, right knee: Secondary | ICD-10-CM | POA: Diagnosis not present

## 2023-06-04 DIAGNOSIS — R29898 Other symptoms and signs involving the musculoskeletal system: Secondary | ICD-10-CM

## 2023-06-04 DIAGNOSIS — M6281 Muscle weakness (generalized): Secondary | ICD-10-CM

## 2023-06-04 DIAGNOSIS — R2689 Other abnormalities of gait and mobility: Secondary | ICD-10-CM

## 2023-06-04 NOTE — Therapy (Addendum)
 OUTPATIENT PHYSICAL THERAPY LOWER EXTREMITY TREATMENT AND DISCHARGE   Patient Name: Natalie Daniels MRN: [id number] DOB:09-17-1970, 52 y.o., female Today's Date: 06/04/2023  END OF SESSION:  PT End of Session - 06/04/23 1613     Visit Number 4    Number of Visits 16    Date for PT Re-Evaluation 07/02/23    Authorization Type aetna    PT Start Time 1530    PT Stop Time 1611    PT Time Calculation (min) 41 min    Activity Tolerance Patient tolerated treatment well    Behavior During Therapy WFL for tasks assessed/performed               Past Medical History:  Diagnosis Date   Anxiety    Constipation    Headache    Plantar fasciitis    Rosacea    Past Surgical History:  Procedure Laterality Date   ANTERIOR FUSION LUMBAR SPINE     BACK SURGERY     CESAREAN SECTION     COLONOSCOPY W/ POLYPECTOMY     age 29   Patient Active Problem List   Diagnosis Date Noted   Hemorrhoids 06/26/2022   Tinea corporis 02/13/2022   Primary osteoarthritis of first carpometacarpal joint of one hand, left 07/23/2021   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 08/08/2020   Vitamin D deficiency 04/27/2019   Anxiety 04/27/2019   Other insomnia 04/27/2019   Migraine without aura and without status migrainosus, not intractable 04/27/2019   Post-viral cough syndrome 08/10/2018   Acute costochondritis 08/10/2018   PND (post-nasal drip) 08/10/2018   Patellofemoral pain syndrome 07/29/2017   Chronic pain of right knee 07/03/2017   Former smoker, stopped smoking many years ago 07/03/2017   Allergic rhinitis 11/17/2015   History of motion sickness 11/17/2015   Vertigo 11/17/2015    PCP: Christen Butter, NP  REFERRING PROVIDER: Dr Salomon Fick  REFERRING DIAG: R patellofemoral pain syndrome   THERAPY DIAG:  Patellofemoral syndrome of right knee  Other symptoms and signs involving the musculoskeletal system  Muscle weakness  (generalized)  Other abnormalities of gait and mobility  Rationale for Evaluation and Treatment: Rehabilitation  ONSET DATE: 03/28/23  SUBJECTIVE:   SUBJECTIVE STATEMENT: Pt states she was able to walk up/down 3 flights of stairs without increase in pain. She states she still feels a few "zingers" but that overall she is much better  EVAL: Stepped down and felt a sharp pain in the R knee. Pain has persisted since that time. Antiinflammatory meds have helped some. She still has pain with going up and down stairs or when she is outside. She has pain with sitting with knee bent > 5-10 min. R knee pain intermittently since 2019 following a fall. She has been doing exercises when it flares up and symptoms improve however the pain increased with exercises after fall 03/28/23.  PERTINENT HISTORY: history of R knee pain; bilat hand pain; anxiety; arthritis  PAIN:  Are you having pain? Yes: NPRS scale: 1/10; worst 8/10 with yard work  Pain location: R knee through quad and medial quad area  Pain description: stabbing with yard work; throbbing at night  Aggravating factors: squatting; stairs; sitting with knee bent; walking  Relieving factors: meds; elevation; ice  PRECAUTIONS: None  WEIGHT BEARING RESTRICTIONS: No  FALLS:  Has patient fallen in last 6 months? 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  DIAGNOSTIC FINDINGS: xrays 04/25/23 results not available   PATIENT SURVEYS:  FOTO 47; goal 63  EDEMA:  Minimal edema medial knee   MUSCLE LENGTH: Hamstrings: Right 65 deg;  Left 60 deg   POSTURE: rounded shoulders, forward head, flexed trunk , and weight shift left  PALPATION: Tenderness R medial patella with lateral tracking with knee extension  Tenderness to palpation through the lateral quad to insertion of lateral quad in superior lateral patella   LOWER EXTREMITY ROM:  Active ROM Right eval Left eval Right 12/18  Hip flexion     Hip extension     Hip abduction     Hip adduction     Hip internal rotation     Hip external rotation     Knee flexion 130 130 130  Knee extension -10 0 -5  Ankle dorsiflexion     Ankle plantarflexion     Ankle inversion     Ankle eversion      (Blank rows = not tested)  LOWER EXTREMITY MMT:  MMT Right eval Left eval Right 12/18  Hip flexion   5  Hip extension 4  4  Hip abduction 4  4+  Hip adduction     Hip internal rotation     Hip external rotation     Knee flexion 4  4+  Knee extension 4+  5  Ankle dorsiflexion     Ankle plantarflexion     Ankle inversion     Ankle eversion      (Blank rows = not tested)  LOWER EXTREMITY SPECIAL TESTS:  Knee special tests: Patellafemoral apprehension test: positive , Patellafemoral grind test: positive , and Step up/down test: positive   FUNCTIONAL TESTS:  Unable to perform SLS R LE   GAIT: Distance walked: 40 ft  Assistive device utilized: None Level of assistance: Complete Independence Comments: antalgic gait with decreased wt bearing R with stance R; R LE in ER    [facility] Adult PT Treatment:                                                DATE: 06/04/23 Therapeutic Exercise: Recumbent bike L3 x 5 min MMT and ROM testing (see above) Stair negotiation 2 full flights reciprocal pattern without handrail Clam with red TB - can't feel in glute med Bent over hip ext with knee flex x 10 bilat Captain morgan x 10 bilat Wall slide with ball squeeze 2 x 10  Therapeutic Activity: FOTO 72   [facility] Adult PT Treatment:                                                 DATE: 05/28/23 Therapeutic Exercise: Nustep LEs only L6 x 5 min Lateral step ups 4'' step x 10, 6'' step x 10 Captain morgans x 10 Side step green TB 15' x 4 SLR with ER 2  x 10 Bridge with ball squeeze 10 x 5 sec hold Sidelying clam with ball between ankles x 20 Sidelying hip abd 2 x 10 Squat touch 2 x 10 SLS 3 x 30 sec   PATIENT EDUCATION:  Education details: POC; HEP  Person educated: Patient Education method: Explanation, Demonstration, Tactile cues, Verbal cues, and Handouts Education comprehension: verbalized understanding, returned demonstration, verbal cues required, tactile cues required, and needs further education  HOME EXERCISE PROGRAM: Access Code: [email or web address] URL: [email or web address] Date: 06/04/2023 Prepared by: Reggy Eye  Exercises - Supine Quad Set  - 2 x daily - 7 x weekly - 1 sets - 10 reps - 3 sec  hold - Straight Leg Raise with External Rotation  - 2 x daily - 7 x weekly - 1 sets - 10 reps - 3-5 sec  hold - Hooklying Hamstring Stretch with Strap  - 2 x daily - 7 x weekly - 1 sets - 3 reps - 30 sec  hold - Single Leg Stance with Support  - 2 x daily - 7 x weekly - 1 sets - 3 reps - 30 sec  hold - Squat with Chair Touch  - 1 x daily - 7 x weekly - 3 sets - 10 reps - Hip Extension with Single Leg Support Prone on Table Edge  - 1 x daily - 7 x weekly - 3 sets - 10 reps - Wall Squat with Ball between Knees  - 1 x daily - 7 x weekly - 3 sets - 10 reps  ASSESSMENT:  CLINICAL IMPRESSION: Pt has improved strength and ROM. She is able to perform functional daily activities without pain. She still has deficits in hip extension strength and knee extension ROM. She is progressing well towards LTGs  EVAL: Patient is a 52 y.o. female who was seen today for physical therapy evaluation and treatment for R patellofemoral syndrome. Patient had initial injury to R nee in 2019 and has experienced some periodic flare ups since that time. Recent flare up  occurred 03/28/23 when patient injured R knee stepping down from a step to the back yard. Pain has persisted since that time. Patient has decreased R knee ROM; decreased strength R LE; antalgic gait; muscular tightness R quad/patellar area; lateral tracking patella; pain with functional activities. Patient will benefit from PT to address problems identified.    OBJECTIVE IMPAIRMENTS: Abnormal gait, decreased activity tolerance, decreased balance, decreased endurance, decreased mobility, difficulty walking, decreased ROM, decreased strength, increased fascial restrictions, increased muscle spasms, impaired flexibility, improper body mechanics, postural dysfunction, and pain.    GOALS: Goals reviewed with patient? Yes  SHORT TERM GOALS: Target date: 06/04/2023  Independent initial HEP  Baseline: Goal status: MET  2.  Decrease pain R knee by 25-50% allowing patient to progress with exercises and functional activities  Baseline:  Goal status: MET  3.  Improve FOTO to 55 Baseline: 47 Goal status: MET   LONG TERM GOALS: Target date: 07/02/2023   Full pain free ROM R knee  Baseline:  Goal status: INITIAL  2.  5/5 strength R LE  Baseline:  Goal status: IN PROGRESS  3.  Normal gait pattern level surfaces and stairs  Baseline:  Goal status: MET  4.  Ability to sit for 30-60 min without increased R knee pain  Baseline:  Goal status: IN PROGRESS  5.  Independent in advanced HEP  Baseline:  Goal status: INITIAL  6.  Improve FOTO to 63 Baseline:  Goal status: MET   PLAN:  PT FREQUENCY: 1-2x/week  PT DURATION: 8 weeks  PLANNED INTERVENTIONS: 97164- PT Re-evaluation, 97110-Therapeutic exercises, 97530- Therapeutic activity, [id number]- Neuromuscular re-education, 97535- Self Care, 16109- Manual therapy, [id number]- Gait training, [phone]- Aquatic Therapy, 97014- Electrical stimulation (unattended), 97035- Ultrasound, 09811- Ionotophoresis 4mg /ml Dexamethasone, Patient/Family education,  Balance training, Stair training, Taping, Dry Needling, Joint mobilization, Cryotherapy, and Moist heat  PLAN FOR NEXT SESSION: knee strength, hip abductor strength  PHYSICAL THERAPY DISCHARGE SUMMARY  Visits from Start of Care: 4  Current functional level related to goals / functional outcomes: Decreased pain, improved strength and gait   Remaining deficits: See above   Education / Equipment: HEP   Patient agrees to discharge. Patient goals were partially met. Patient is being discharged due to not returning since the last visit. Reggy Eye, PT,DPT03/14/259:49 AM   Kshawn Canal, PT 06/04/2023, 4:14 PM

## 2023-06-06 ENCOUNTER — Ambulatory Visit (INDEPENDENT_AMBULATORY_CARE_PROVIDER_SITE_OTHER): Payer: No Typology Code available for payment source | Admitting: Sports Medicine

## 2023-06-06 DIAGNOSIS — M222X1 Patellofemoral disorders, right knee: Secondary | ICD-10-CM | POA: Diagnosis not present

## 2023-06-06 NOTE — Progress Notes (Signed)
    Procedures performed today:    None.  Independent interpretation of notes and tests performed by another provider:   None.  Brief History, Exam, Impression, and Recommendations:    Patellofemoral pain syndrome Exquisitely pleasant 52 year old female, long history of knee pain, we diagnosed her with patellofemoral syndrome, she had weak hip abductors, tenderness at the patellar facets. We added aggressive physical therapy focusing on the vastus medialis and the hip abductor's, she returns today doing significantly better and continuing to improve, she can continue this indefinitely and return to see me as needed.    ____________________________________________ Ihor Austin. Benjamin Stain, M.D., ABFM., CAQSM., AME. Primary Care and Sports Medicine [facility]  Adjunct Professor of Family Medicine  [place] [employer]  Restaurant manager, fast food

## 2023-06-06 NOTE — Assessment & Plan Note (Signed)
Exquisitely pleasant 52 year old female, long history of knee pain, we diagnosed her with patellofemoral syndrome, she had weak hip abductors, tenderness at the patellar facets. We added aggressive physical therapy focusing on the vastus medialis and the hip abductor's, she returns today doing significantly better and continuing to improve, she can continue this indefinitely and return to see me as needed.

## 2023-06-09 ENCOUNTER — Encounter: Payer: No Typology Code available for payment source | Admitting: Rehabilitative and Restorative Service Providers"

## 2023-06-16 ENCOUNTER — Ambulatory Visit: Payer: No Typology Code available for payment source | Admitting: Physical Therapy

## 2023-08-07 ENCOUNTER — Other Ambulatory Visit: Payer: Self-pay | Admitting: Medical-Surgical

## 2023-09-02 ENCOUNTER — Other Ambulatory Visit: Payer: Self-pay | Admitting: Medical-Surgical

## 2023-09-11 ENCOUNTER — Encounter: Payer: Self-pay | Admitting: Medical-Surgical

## 2023-09-11 ENCOUNTER — Ambulatory Visit (INDEPENDENT_AMBULATORY_CARE_PROVIDER_SITE_OTHER): Admitting: Medical-Surgical

## 2023-09-11 VITALS — BP 134/79 | HR 73 | Resp 20 | Ht 68.0 in | Wt 171.1 lb

## 2023-09-11 DIAGNOSIS — H9190 Unspecified hearing loss, unspecified ear: Secondary | ICD-10-CM

## 2023-09-11 DIAGNOSIS — F419 Anxiety disorder, unspecified: Secondary | ICD-10-CM | POA: Diagnosis not present

## 2023-09-11 DIAGNOSIS — E559 Vitamin D deficiency, unspecified: Secondary | ICD-10-CM

## 2023-09-11 DIAGNOSIS — Z0000 Encounter for general adult medical examination without abnormal findings: Secondary | ICD-10-CM | POA: Diagnosis not present

## 2023-09-11 DIAGNOSIS — R413 Other amnesia: Secondary | ICD-10-CM | POA: Diagnosis not present

## 2023-09-11 DIAGNOSIS — R635 Abnormal weight gain: Secondary | ICD-10-CM

## 2023-09-11 DIAGNOSIS — K219 Gastro-esophageal reflux disease without esophagitis: Secondary | ICD-10-CM

## 2023-09-11 DIAGNOSIS — I951 Orthostatic hypotension: Secondary | ICD-10-CM

## 2023-09-11 MED ORDER — FAMOTIDINE 20 MG PO TABS: 20.0000 mg | ORAL_TABLET | Freq: Two times a day (BID) | ORAL | 1 refills | Status: AC | PRN

## 2023-09-11 MED ORDER — TRAZODONE HCL 50 MG PO TABS: 25.0000 mg | ORAL_TABLET | Freq: Every evening | ORAL | 3 refills | Status: DC | PRN

## 2023-09-11 NOTE — Progress Notes (Signed)
 Complete physical exam  Patient: Natalie Daniels   DOB: Apr 08, 1971   53 y.o. Female  MRN: [id number]  Subjective:    Chief Complaint  Patient presents with   Annual Exam    Davene Jobin is a 53 y.o. female who presents today for a complete physical exam. She reports consuming a general diet.  Intermittent exercise when possible   She generally feels fairly well. She reports sleeping poorly. She does have additional problems to discuss today.    Most recent fall risk assessment:    11/22/2022    8:29 AM  Fall Risk   Falls in the past year? 1  Number falls in past yr: 1  Injury with Fall? 0  Risk for fall due to : Impaired balance/gait  Follow up Falls evaluation completed     Most recent depression screenings:    11/22/2022    8:30 AM 10/22/2022    8:28 AM  PHQ 2/9 Scores  PHQ - 2 Score 0 0    Vision:Within last year and Dental: No current dental problems and Receives regular dental care    Patient Care Team: Christen Butter, NP as PCP - General (Nurse Practitioner) Jacqlyn Krauss, MD as Referring Physician (Dermatology) Annamaria Helling, MD as Consulting Physician (Obstetrics and Gynecology)   Outpatient Medications Prior to Visit  Medication Sig   BIOTIN PO biotin   fexofenadine (ALLEGRA) 60 MG tablet Take 60 mg by mouth 2 (two) times daily.   metroNIDAZOLE (METROGEL) 1 % gel metronidazole 1 % topical gel   Multiple Vitamin (MULTIVITAMIN) capsule Take 1 capsule by mouth daily.   nystatin (MYCOSTATIN) 100000 UNIT/ML suspension Take 5 mLs (500,000 Units total) by mouth 4 (four) times daily. Swish for 30 seconds and spit out.   Omega-3 Fatty Acids (FISH OIL PO) Take by mouth.   pantoprazole (PROTONIX) 20 MG tablet TAKE 1 TABLET (20 MG TOTAL) BY MOUTH DAILY. NEEDS APPOINTMENT FOR FURTHER REFILLS.   POTASSIUM PO Take 1 tablet by mouth daily.   Probiotic Product (PROBIOTIC DAILY PO) Take by mouth.   Sulfacetamide Sodium, Acne, 10 % LOTN 1 APPLICATION TWICE A DAY  EXTERNALLY 30 DAYS   [DISCONTINUED] famotidine (PEPCID) 20 MG tablet TAKE 1 TABLET (20 MG TOTAL) BY MOUTH 2 (TWO) TIMES DAILY AS NEEDED FOR HEARTBURN OR INDIGESTION.   [DISCONTINUED] desvenlafaxine (PRISTIQ) 50 MG 24 hr tablet Take 1 tablet (50 mg total) by mouth daily.   [DISCONTINUED] Diclofenac Sodium 3 % GEL Apply once or twice daily to affected areas   [DISCONTINUED] ibuprofen (ADVIL) 800 MG tablet Take 1 tablet (800 mg total) by mouth every 8 (eight) hours as needed.   [DISCONTINUED] mupirocin ointment (BACTROBAN) 2 % Apply to affected area TID for 7 days.   [DISCONTINUED] valACYclovir (VALTREX) 1000 MG tablet Take 2 tablets (2,000 mg total) by mouth 2 (two) times daily.   No facility-administered medications prior to visit.   Review of Systems  Constitutional:  Negative for chills, fever, malaise/fatigue and weight loss.  HENT:  Positive for hearing loss. Negative for congestion, ear pain, sinus pain and sore throat.   Eyes:  Negative for blurred vision, photophobia and pain.  Respiratory:  Negative for cough, shortness of breath and wheezing.   Cardiovascular:  Negative for chest pain, palpitations and leg swelling.  Gastrointestinal:  Positive for heartburn. Negative for abdominal pain, constipation, diarrhea, nausea and vomiting.  Genitourinary:  Negative for dysuria, frequency and urgency.  Musculoskeletal:  Negative for falls and neck pain.  Skin:  Negative for itching and rash.  Neurological:  Positive for dizziness. Negative for weakness and headaches.       Subjective memory loss   Endo/Heme/Allergies:  Negative for polydipsia. Does not bruise/bleed easily.  Psychiatric/Behavioral:  Negative for depression, substance abuse and suicidal ideas. The patient is nervous/anxious and has insomnia.      Objective:    BP 134/79 (BP Location: Left Arm, Cuff Size: Normal)   Pulse 73   Resp 20   Ht 5\' 8"  (1.727 m)   Wt 171 lb 1.9 oz (77.6 kg)   SpO2 100%   BMI 26.02 kg/m     Physical Exam Vitals reviewed.  Constitutional:      General: She is not in acute distress.    Appearance: Normal appearance. She is not ill-appearing.  HENT:     Head: Normocephalic and atraumatic.     Right Ear: Tympanic membrane, ear canal and external ear normal. There is no impacted cerumen.     Left Ear: Tympanic membrane, ear canal and external ear normal. There is no impacted cerumen.     Nose: Nose normal. No congestion or rhinorrhea.     Mouth/Throat:     Mouth: Mucous membranes are moist.     Pharynx: No oropharyngeal exudate or posterior oropharyngeal erythema.  Eyes:     General: No scleral icterus.       Right eye: No discharge.        Left eye: No discharge.     Extraocular Movements: Extraocular movements intact.     Conjunctiva/sclera: Conjunctivae normal.     Pupils: Pupils are equal, round, and reactive to light.  Neck:     Thyroid: No thyromegaly.     Vascular: No carotid bruit or JVD.     Trachea: Trachea normal.  Cardiovascular:     Rate and Rhythm: Normal rate and regular rhythm.     Pulses: Normal pulses.     Heart sounds: Normal heart sounds. No murmur heard.    No friction rub. No gallop.  Pulmonary:     Effort: Pulmonary effort is normal. No respiratory distress.     Breath sounds: Normal breath sounds. No wheezing.  Abdominal:     General: Bowel sounds are normal. There is no distension.     Palpations: Abdomen is soft.     Tenderness: There is no abdominal tenderness. There is no guarding.  Musculoskeletal:        General: Normal range of motion.     Cervical back: Normal range of motion and neck supple.  Lymphadenopathy:     Cervical: No cervical adenopathy.  Skin:    General: Skin is warm and dry.  Neurological:     Mental Status: She is alert and oriented to person, place, and time.     Cranial Nerves: No cranial nerve deficit.  Psychiatric:        Mood and Affect: Mood normal.        Behavior: Behavior normal.        Thought  Content: Thought content normal.        Judgment: Judgment normal.      No results found for any visits on 09/11/23.     Assessment & Plan:    Routine Health Maintenance and Physical Exam  Immunization History  Administered Date(s) Administered   Influenza Inj Mdck Quad Pf 02/17/2018   Influenza,inj,Quad PF,6+ Mos 04/26/2016, 03/05/2017, 03/25/2019   Influenza-Unspecified 03/17/2017, 03/30/2020   Td 05/08/2007   Tdap 07/03/2017  Zoster Recombinant(Shingrix) 01/09/2022    Health Maintenance  Topic Date Due   Colonoscopy  Never done   INFLUENZA VACCINE  09/15/2023 (Originally 01/16/2023)   COVID-19 Vaccine (1 - 2024-25 season) 09/27/2023 (Originally 02/16/2023)   Zoster Vaccines- Shingrix (2 of 2) 12/12/2023 (Originally 03/06/2022)   Hepatitis C Screening  09/10/2024 (Originally 01/04/1989)   HIV Screening  09/10/2024 (Originally 01/04/1986)   Cervical Cancer Screening (HPV/Pap Cotest)  01/03/2024   MAMMOGRAM  01/08/2024   DTaP/Tdap/Td (3 - Td or Tdap) 07/04/2027   HPV VACCINES  Aged Out    Discussed health benefits of physical activity, and encouraged her to engage in regular exercise appropriate for her age and condition.  1. Annual physical exam (Primary) Checking labs as below. UTD on preventative care. Wellness information provided with AVS. - CBC with Differential/Platelet - CMP14+EGFR - Lipid panel  2. Subjective hearing change No ear canal obstruction or TM abnormality noted. Referring to Audiology for further evaluation.  - Ambulatory referral to Audiology  3. Memory loss Some subjective memory loss. MOCA completed at bedside with a score of 28/30 which is normal. If symptoms persist or worsen, advised to let me know as a referral for neuropsychiatric testing may be beneficial.   4. Anxiety Doing much better overall. Continue counseling. Declines medication today.   5. Orthostasis Suspect periods of dizziness with position changes related to orthostasis.  Recommend working to stay well hydrated and make sure to change positions slowly to prevent dizziness.   6. Vitamin D deficiency Checking vitamin D.  - VITAMIN D 25 Hydroxy (Vit-D Deficiency, Fractures)  7. Gastroesophageal reflux disease, unspecified whether esophagitis present Taking Protonix 20mg  daily with Pepcid 20mg  twice daily. Tolerating well. No SE. Feels the medication works well unless she eats something she knows she probably shouldn't. - famotidine (PEPCID) 20 MG tablet; Take 1 tablet (20 mg total) by mouth 2 (two) times daily as needed for heartburn or indigestion.  Dispense: 180 tablet; Refill: 1  8. Weight gain Checking TSH and A1c.  - TSH - Hemoglobin A1c   Return in about 6 months (around 03/13/2024) for chronic disease follow up.     Christen Butter, NP

## 2023-09-17 ENCOUNTER — Encounter: Payer: Self-pay | Admitting: Medical-Surgical

## 2023-09-17 LAB — TSH: TSH: 2.21 u[IU]/mL (ref 0.450–4.500)

## 2023-09-17 LAB — CMP14+EGFR
ALT: 17 IU/L (ref 0–32)
AST: 20 IU/L (ref 0–40)
Albumin: 4.7 g/dL (ref 3.8–4.9)
Alkaline Phosphatase: 88 IU/L (ref 44–121)
BUN/Creatinine Ratio: 20 (ref 9–23)
BUN: 17 mg/dL (ref 6–24)
Bilirubin Total: 0.4 mg/dL (ref 0.0–1.2)
CO2: 23 mmol/L (ref 20–29)
Calcium: 9.5 mg/dL (ref 8.7–10.2)
Chloride: 103 mmol/L (ref 96–106)
Creatinine, Ser: 0.87 mg/dL (ref 0.57–1.00)
Globulin, Total: 2.2 g/dL (ref 1.5–4.5)
Glucose: 91 mg/dL (ref 70–99)
Potassium: 4.5 mmol/L (ref 3.5–5.2)
Sodium: 142 mmol/L (ref 134–144)
Total Protein: 6.9 g/dL (ref 6.0–8.5)
eGFR: 80 mL/min/{1.73_m2} (ref >59)

## 2023-09-17 LAB — CBC WITH DIFFERENTIAL/PLATELET
Basophils Absolute: 0.1 10*3/uL (ref 0.0–0.2)
Basos: 1 %
EOS (ABSOLUTE): 0.3 10*3/uL (ref 0.0–0.4)
Eos: 4 %
Hematocrit: 42.1 % (ref 34.0–46.6)
Hemoglobin: 13.5 g/dL (ref 11.1–15.9)
Immature Grans (Abs): 0 10*3/uL (ref 0.0–0.1)
Immature Granulocytes: 0 %
Lymphocytes Absolute: 2.6 10*3/uL (ref 0.7–3.1)
Lymphs: 36 %
MCH: 31.3 pg (ref 26.6–33.0)
MCHC: 32.1 g/dL (ref 31.5–35.7)
MCV: 98 fL — ABNORMAL HIGH (ref 79–97)
Monocytes Absolute: 0.6 10*3/uL (ref 0.1–0.9)
Monocytes: 7 %
Neutrophils Absolute: 3.8 10*3/uL (ref 1.4–7.0)
Neutrophils: 52 %
Platelets: 227 10*3/uL (ref 150–450)
RBC: 4.32 x10E6/uL (ref 3.77–5.28)
RDW: 12.8 % (ref 11.7–15.4)
WBC: 7.4 10*3/uL (ref 3.4–10.8)

## 2023-09-17 LAB — LIPID PANEL
Chol/HDL Ratio: 3 ratio (ref 0.0–4.4)
Cholesterol, Total: 212 mg/dL — ABNORMAL HIGH (ref 100–199)
HDL: 71 mg/dL (ref >39)
LDL Chol Calc (NIH): 123 mg/dL — ABNORMAL HIGH (ref 0–99)
Triglycerides: 103 mg/dL (ref 0–149)
VLDL Cholesterol Cal: 18 mg/dL (ref 5–40)

## 2023-09-17 LAB — VITAMIN D 25 HYDROXY (VIT D DEFICIENCY, FRACTURES): Vit D, 25-Hydroxy: 49.9 ng/mL (ref 30.0–100.0)

## 2023-09-17 LAB — HEMOGLOBIN A1C
Est. average glucose Bld gHb Est-mCnc: 111 mg/dL
Hgb A1c MFr Bld: 5.5 % (ref 4.8–5.6)

## 2023-09-25 ENCOUNTER — Other Ambulatory Visit: Payer: Self-pay | Admitting: Medical-Surgical

## 2023-10-03 ENCOUNTER — Encounter: Payer: Self-pay | Admitting: Medical-Surgical

## 2023-10-04 ENCOUNTER — Other Ambulatory Visit: Payer: Self-pay | Admitting: Medical-Surgical

## 2023-10-08 ENCOUNTER — Ambulatory Visit (INDEPENDENT_AMBULATORY_CARE_PROVIDER_SITE_OTHER): Admitting: Podiatry

## 2023-10-08 DIAGNOSIS — L600 Ingrowing nail: Secondary | ICD-10-CM | POA: Diagnosis not present

## 2023-10-08 NOTE — Progress Notes (Signed)
 Subjective:  Patient ID: Natalie Daniels, female    DOB: 1971-03-24,  MRN: [id number]  Chief Complaint  Patient presents with   Nail Problem    53 y.o. female presents with the above complaint.  Patient presents with left hallux medial border ingrown painful to touch is progressive gotten worse worse with ambulation as her pressure would like to have removed however patient has a beach trip coming up has not seen anyone as prior to seeing me denies any other acute complaints   Review of Systems: Negative except as noted in the HPI. Denies N/V/F/Ch.  Past Medical History:  Diagnosis Date   Anxiety    Constipation    Headache    Plantar fasciitis    Rosacea     Current Outpatient Medications:    BIOTIN PO, biotin, Disp: , Rfl:    famotidine  (PEPCID ) 20 MG tablet, Take 1 tablet (20 mg total) by mouth 2 (two) times daily as needed for heartburn or indigestion., Disp: 180 tablet, Rfl: 1   fexofenadine (ALLEGRA) 60 MG tablet, Take 60 mg by mouth 2 (two) times daily., Disp: , Rfl:    metroNIDAZOLE (METROGEL) 1 % gel, metronidazole 1 % topical gel, Disp: , Rfl:    Multiple Vitamin (MULTIVITAMIN) capsule, Take 1 capsule by mouth daily., Disp: , Rfl:    nystatin  (MYCOSTATIN ) 100000 UNIT/ML suspension, Take 5 mLs (500,000 Units total) by mouth 4 (four) times daily. Swish for 30 seconds and spit out., Disp: 473 mL, Rfl: 0   Omega-3 Fatty Acids (FISH OIL PO), Take by mouth., Disp: , Rfl:    pantoprazole  (PROTONIX ) 20 MG tablet, Take 1 tablet (20 mg total) by mouth daily., Disp: 90 tablet, Rfl: 1   POTASSIUM PO, Take 1 tablet by mouth daily., Disp: , Rfl:    Probiotic Product (PROBIOTIC DAILY PO), Take by mouth., Disp: , Rfl:    Sulfacetamide Sodium, Acne, 10 % LOTN, 1 APPLICATION TWICE A DAY EXTERNALLY 30 DAYS, Disp: , Rfl:    traZODone  (DESYREL ) 50 MG tablet, Take 0.5-1 tablets (25-50 mg total) by mouth at bedtime as needed for sleep., Disp: 30 tablet, Rfl: 3  Social History   Tobacco Use   Smoking Status Former   Current packs/day: 0.00   Average packs/day: 1 pack/day for 8.0 years (8.0 ttl pk-yrs)   Types: Cigarettes   Start date: 06/17/1986   Quit date: 06/17/1994   Years since quitting: 29.3  Smokeless Tobacco Never    Allergies  Allergen Reactions   Amitriptyline  Hcl Other (See Comments)    Hallucinations.   Aspirin Nausea And Vomiting   Diclofenac  Hives and Itching   Doxycycline Hives   Celexa  [Citalopram ] Other (See Comments)    WHITE BUMPS ON TONGUE   Eszopiclone Other (See Comments)   Ivermectin Other (See Comments)   Suvorexant  Other (See Comments)   Linaclotide Hives and Rash   Objective:  There were no vitals filed for this visit. There is no height or weight on file to calculate BMI. Constitutional Well developed. Well nourished.  Vascular Dorsalis pedis pulses palpable bilaterally. Posterior tibial pulses palpable bilaterally. Capillary refill normal to all digits.  No cyanosis or clubbing noted. Pedal hair growth normal.  Neurologic Normal speech. Oriented to person, place, and time. Epicritic sensation to light touch grossly present bilaterally.  Dermatologic Painful ingrowing nail at medial nail borders of the hallux nail left. No other open wounds. No skin lesions.  Orthopedic: Normal joint ROM without pain or crepitus bilaterally. No visible deformities.  No bony tenderness.   Radiographs: None Assessment:   1. Ingrown left big toenail    Plan:  Patient was evaluated and treated and all questions answered.  Ingrown Nail, left - Patient is leaving for beach trip and I discussed with her to can hold off on doing an ingrown nail procedure for now.  Slant back procedure was performed in standard technique.  She will come back and see me after her trip for ingrown nail removal she states understanding. No follow-ups on file.

## 2023-10-17 MED ORDER — NYSTATIN 100000 UNIT/ML MT SUSP: 500000.0000 [IU] | Freq: Four times a day (QID) | OROMUCOSAL | 0 refills | Status: AC

## 2023-10-23 ENCOUNTER — Ambulatory Visit: Attending: Medical-Surgical | Admitting: Audiologist

## 2023-10-23 DIAGNOSIS — H9193 Unspecified hearing loss, bilateral: Secondary | ICD-10-CM | POA: Diagnosis present

## 2023-10-23 DIAGNOSIS — H903 Sensorineural hearing loss, bilateral: Secondary | ICD-10-CM | POA: Insufficient documentation

## 2023-10-23 NOTE — Procedures (Signed)
  Outpatient Audiology and [facility] [address] [place], [place]  [address] [phone]  AUDIOLOGICAL  EVALUATION  NAME: Natalie Daniels     DOB:   08/05/1970      MRN: [id number]                                                                                     DATE: 10/23/2023     REFERENT: Cherre Cornish, NP STATUS: Outpatient DIAGNOSIS: Sensorineural Hearing Loss Mild Bilaterally    History: [name] was seen for an audiological evaluation due to difficulty hearing her husband and the TV.  [name] denies pain, pressure, or tinnitus. She has a high pitched intermittent ring that started a few months ago. It is brief.  [name] no history of hazardous noise exposure. No family history of hearing loss. She has trouble pressure change in her ears, she dreads flying due to the pain in her ears when they land.  Medical history shows no additional risk for hearing loss.    Evaluation:  Otoscopy showed a clear view of the tympanic membranes, bilaterally Tympanometry results were consistent with normal middle ear function bilaterally Audiometric testing was completed using Conventional Audiometry techniques with insert earphones and supraural headphones. Test results are consistent with slight low-frequency hearing loss 250 through 1kHz bilaterally rising to normal, sloping to mild bilateral sensorineural hearing loss 3 through 8 kHz bilaterally. Speech Recognition Thresholds were obtained at 20 dB HL in the right ear and at 20 dB HL in the left ear. Word Recognition Testing was completed at  40dB SL and [name] scored 100% in each ear.   Results:  The test results were reviewed with [name].  Her hearing is mostly normal in both ears.  She does have slight low-frequency hearing loss and a very mild high-frequency hearing loss.  This would not significantly impact day-to-day function.  Recommend trying an over-the-counter hearing aid to see if this helps due to her difficulty  hearing her husband and hearing the TV.  She says they already have a sound bar and surround sound. Audiogram printed and provided to [name].    Recommendations: OTC hearing aids recommended for both ears. Patient given list of over-the-counter hearing aids that can be bought online that have been tested by the FDA.  Due to configuration of hearing loss, recommend the patient be seen again in a few months to monitor this for progression.  She is not sure of when exactly it started.  30 minutes spent testing and counseling on results.   If you have any questions please feel free to contact me at [phone].  Raynald Calkins Stalnaker Au.D.  Audiologist   10/23/2023  5:24 PM  Cc: Cherre Cornish, NP

## 2023-10-29 ENCOUNTER — Ambulatory Visit: Admitting: Podiatry

## 2023-11-03 ENCOUNTER — Ambulatory Visit

## 2023-11-03 ENCOUNTER — Ambulatory Visit: Admitting: Podiatry

## 2023-11-03 DIAGNOSIS — M7752 Other enthesopathy of left foot: Secondary | ICD-10-CM

## 2023-11-03 MED ORDER — TRIAMCINOLONE ACETONIDE 10 MG/ML IJ SUSP
10.0000 mg | Freq: Once | INTRAMUSCULAR | Status: AC
  Administered 2023-11-03: 10 mg via INTRA_ARTICULAR

## 2023-11-04 NOTE — Progress Notes (Signed)
 Subjective:   Patient ID: Natalie Daniels, female   DOB: 53 y.o.   MRN: [id number]   HPI Patient states that she has developed a lot of pain in the left forefoot and states it just has occurred recently around the 2nd and 3rd joint   ROS      Objective:  Physical Exam  Neurovascular status intact with inflammation fluid of the 2nd and 3rd MPJ left foot     Assessment:  Inflammatory capsulitis 2nd and 3rd MPJ left foot     Plan:  Patient.  X-ray reviewed went ahead today and did a proximal nerve block I then aspirated the 2nd and 3rd MPJ getting out a small amount of clear fluid injected with quarter cc dexamethasone Kenalog  separately into each joint applied to separate sterile dressings instructed on rigid bottom shoes reappoint to recheck may require new orthotics  X-rays indicate slight elongation of the metatarsals no indications of other pathology

## 2023-11-12 ENCOUNTER — Ambulatory Visit: Admitting: Podiatry

## 2023-11-17 ENCOUNTER — Encounter: Payer: Self-pay | Admitting: Podiatry

## 2023-11-17 ENCOUNTER — Ambulatory Visit (INDEPENDENT_AMBULATORY_CARE_PROVIDER_SITE_OTHER): Admitting: Podiatry

## 2023-11-17 VITALS — Ht 68.0 in | Wt 171.0 lb

## 2023-11-17 DIAGNOSIS — M7752 Other enthesopathy of left foot: Secondary | ICD-10-CM | POA: Diagnosis not present

## 2023-11-17 DIAGNOSIS — L600 Ingrowing nail: Secondary | ICD-10-CM

## 2023-11-19 NOTE — Progress Notes (Signed)
 Subjective:   Patient ID: Natalie Daniels, female   DOB: 53 y.o.   MRN: [id number]   HPI Patient states currently she is feeling quite a bit better still having some discomfort but improved   ROS      Objective:  Physical Exam  Neurovascular status intact inflammation of the second MPJ left improved but still slight discomfort upon palpation     Assessment:  Capsulitis improving but still present     Plan:  H&P reviewed recommended rigid bottom shoes exercises ice as needed and hopefully this will completely get better.  May eventually require shortening osteotomy if symptoms persist

## 2023-12-25 ENCOUNTER — Ambulatory Visit: Admitting: Podiatry

## 2023-12-25 DIAGNOSIS — L600 Ingrowing nail: Secondary | ICD-10-CM

## 2023-12-25 DIAGNOSIS — M7752 Other enthesopathy of left foot: Secondary | ICD-10-CM

## 2023-12-25 NOTE — Patient Instructions (Signed)

## 2023-12-26 NOTE — Progress Notes (Signed)
 Subjective:   Patient ID: Natalie Daniels, female   DOB: 53 y.o.   MRN: [id number]   HPI Patient states she is ready to get her ingrown toenail fixed today and also she does have some discomfort in the joint still not as bad as it was previously   ROS      Objective:  Physical Exam  Neurovascular status intact with incurvated medial border left big toenail painful when pressed with structural damage to the underlying bed and mild inflammation around the second MPJ left     Assessment:  Ingrown toenail deformity of the left hallux medial border with pain along with mild to moderate inflammation     Plan:  H&P reviewed recommended correction of deformity explained procedure risk and patient wants this fixed.  I did explain what would be done and reviewed the consent form which she signed at today infiltrated to the left big toe 60 mg like a Marcaine mixture sterile prep done using sterile instrumentation removed the medial border exposed matrix applied phenol 3 applications 30 seconds followed by alcohol sterile dressing gave instructions on soaks wear dressing 24 hours take it off earlier if throbbing were to occur and encouraged to call with questions concerns

## 2024-01-07 ENCOUNTER — Ambulatory Visit: Payer: Self-pay

## 2024-01-07 NOTE — Telephone Encounter (Signed)
 FYI Only or Action Required?: FYI only for provider.  Patient was last seen in primary care on 09/11/2023 by Willo Mini, NP.  Called Nurse Triage reporting Shaking.  Symptoms began few weeks ago .  Interventions attempted: Nothing.  Symptoms are: unchanged.  Triage Disposition: See PCP When Office is Open (Within 3 Days)  Patient/caregiver understands and will follow disposition?: yes        Copied from CRM #[id number]. Topic: Clinical - Red Word Triage >> Jan 07, 2024  8:51 AM Zane F wrote: [facility] that prompted transfer to Nurse Triage:   Uncontrollably shaking internally; not physical  Occurs multiple times a day Reason for Disposition  Shuddering (trembling) occurs without an obvious cause  Answer Assessment - Initial Assessment Questions 1. SYMPTOM: What is the main symptom you are concerned about? (e.g., weakness, numbness)     Internal feeling of shkiness 2. ONSET: When did this start? (e.g., minutes, hours, days; while sleeping)     Few weeks  4. PATTERN Does this come and go, or has it been constant since it started?  Is it present now?     Comes and goes  5. CARDIAC SYMPTOMS: Have you had any of the following symptoms: chest pain, difficulty breathing, palpitations?     no 6. NEUROLOGIC SYMPTOMS: Have you had any of the following symptoms: headache, dizziness, vision loss, double vision, changes in speech, unsteady on your feet?     Dizziness (mild), headache 7. OTHER SYMPTOMS: Do you have any other symptoms?     no  Answer Assessment - Initial Assessment Questions 1. APPEARANCE of MOVEMENT: What did the jerking or twitching look like? (e.g., body area)     Nothing visible from outside 2. ONSET: When did this start happening? (e.g., hours, days, weeks, months ago)     Few weeks ago 3. DURATION: How long does the jerk, twitch, or spasm last?     Comes and goes l 4. FREQUENCY:  How often does this happen?      Several times a  day 5. WHEN: When does this happen? (e.g., while awake, while falling asleep, while sleeping)     While wake 6. CAUSE: What do you think caused the internal shaking?     unknown 7. OTHER SYMPTOMS: Are there any other symptoms? (e.g., fever, headache)     Headache, occ. dizziness 8. PREGNANCY: Is there any chance you are pregnant? When was your last menstrual period?     N/a  Protocols used: Neurologic Deficit-A-AH, Muscle Jerks - Tics - [facility]

## 2024-01-08 ENCOUNTER — Ambulatory Visit (INDEPENDENT_AMBULATORY_CARE_PROVIDER_SITE_OTHER): Admitting: Medical-Surgical

## 2024-01-08 VITALS — BP 118/78 | HR 83 | Resp 20 | Ht 68.0 in | Wt 171.0 lb

## 2024-01-08 DIAGNOSIS — H9193 Unspecified hearing loss, bilateral: Secondary | ICD-10-CM | POA: Diagnosis not present

## 2024-01-08 DIAGNOSIS — H9313 Tinnitus, bilateral: Secondary | ICD-10-CM | POA: Diagnosis not present

## 2024-01-08 DIAGNOSIS — R42 Dizziness and giddiness: Secondary | ICD-10-CM | POA: Diagnosis not present

## 2024-01-08 DIAGNOSIS — N393 Stress incontinence (female) (male): Secondary | ICD-10-CM

## 2024-01-08 DIAGNOSIS — K581 Irritable bowel syndrome with constipation: Secondary | ICD-10-CM

## 2024-01-08 DIAGNOSIS — R519 Headache, unspecified: Secondary | ICD-10-CM

## 2024-01-08 DIAGNOSIS — R251 Tremor, unspecified: Secondary | ICD-10-CM

## 2024-01-08 NOTE — Progress Notes (Signed)
        Established patient visit  Discussed the use of AI scribe software for clinical note transcription with the patient, who gave verbal consent to proceed.  History of Present Illness   Natalie Daniels is a 53 year old female who presents with internal shaking and dizziness.  Internal tremulous sensation and headache - Internal shaking sensation localized to the head, described as a 'mini earthquake' inside - Occurs while sitting and not focusing on it - Present for a few weeks - No visible physical shaking or changes in movement speed - Headaches occur at night and upon waking  Dizziness and orthostatic symptoms - Dizziness occurs when transitioning from lying down to standing - Dizziness lasts a few seconds before spontaneously resolving - No syncope, vision changes, or limb weakness  Auditory symptoms - Tinnitus identified during a recent hearing test - Some hearing loss present  Urinary incontinence and nocturia - Urinary incontinence has worsened over time - Frequent nocturia impacting sleep quality  Cognitive symptoms - Difficulty recalling words - Family history of dementia and Alzheimer's disease  Gastrointestinal symptoms - On Ibsrela for IBS-C since April - Ibsrela is effective      Physical Exam Vitals reviewed.  Constitutional:      General: She is not in acute distress.    Appearance: Normal appearance.  HENT:     Head: Normocephalic and atraumatic.  Cardiovascular:     Rate and Rhythm: Normal rate and regular rhythm.     Pulses: Normal pulses.     Heart sounds: Normal heart sounds. No murmur heard.    No friction rub. No gallop.  Pulmonary:     Effort: Pulmonary effort is normal. No respiratory distress.     Breath sounds: Normal breath sounds. No wheezing.  Skin:    General: Skin is warm and dry.  Neurological:     Mental Status: She is alert and oriented to person, place, and time.  Psychiatric:        Mood and Affect: Mood normal.         Behavior: Behavior normal.        Thought Content: Thought content normal.        Judgment: Judgment normal.     Assessment and Plan    Internal Tremor Internal shaking sensation without external tremors. Differential includes early Parkinson's, neurological issues. Discussed potential neurological conditions and need to rule out brain structural abnormalities. - Order MRI of the brain. - Consider neurology referral if necessary.  Dizziness Dizziness on position change, possibly orthostatic hypotension due to dehydration or medication. Considered neurological involvement due to associated symptoms. - Optimize hydration status - Change positions slowly and practice fall precautions  Hearing Loss and Tinnitus Hearing loss and tinnitus associated with internal tremor and dizziness. Awaiting hearing test results. - Review hearing test results.  Irritable Bowel Syndrome with Constipation (IBS-C) Ibsrela effective for IBS-C. No indication it causes tremor or dizziness. - Continue Ibsrela.  Urinary Incontinence Stress urinary incontinence with dribbling. Previous medications ineffective, declined surgery. Suggested urogynecology referral for pelvic floor dysfunction. - Offer referral to urogynecology.  Follow-up Discussed importance of follow-up for symptoms and potential neurological evaluation. MRI results will guide management.      Return if symptoms worsen or fail to improve.  __________________________________ Zada FREDRIK Palin, DNP, APRN, FNP-BC Primary Care and Sports Medicine [facility] [place]

## 2024-01-14 ENCOUNTER — Encounter: Payer: Self-pay | Admitting: Medical-Surgical

## 2024-01-15 NOTE — Telephone Encounter (Signed)
 It has been authorized, they should be calling soon to schedule

## 2024-01-18 ENCOUNTER — Ambulatory Visit

## 2024-01-18 DIAGNOSIS — R42 Dizziness and giddiness: Secondary | ICD-10-CM | POA: Diagnosis not present

## 2024-01-18 DIAGNOSIS — H9193 Unspecified hearing loss, bilateral: Secondary | ICD-10-CM | POA: Diagnosis not present

## 2024-01-18 DIAGNOSIS — H9313 Tinnitus, bilateral: Secondary | ICD-10-CM

## 2024-01-18 DIAGNOSIS — R251 Tremor, unspecified: Secondary | ICD-10-CM | POA: Diagnosis not present

## 2024-01-20 ENCOUNTER — Ambulatory Visit: Payer: Self-pay | Admitting: Medical-Surgical

## 2024-01-21 ENCOUNTER — Encounter: Payer: Self-pay | Admitting: Medical-Surgical

## 2024-02-17 ENCOUNTER — Encounter: Payer: Self-pay | Admitting: Sports Medicine

## 2024-03-01 ENCOUNTER — Encounter: Payer: Self-pay | Admitting: Podiatry

## 2024-03-01 ENCOUNTER — Ambulatory Visit (INDEPENDENT_AMBULATORY_CARE_PROVIDER_SITE_OTHER): Admitting: Podiatry

## 2024-03-01 DIAGNOSIS — M7752 Other enthesopathy of left foot: Secondary | ICD-10-CM

## 2024-03-01 NOTE — Progress Notes (Signed)
 Subjective:   Patient ID: Natalie Daniels, female   DOB: 53 y.o.   MRN: [id number]   HPI States the ingrown toenail seems to be healing well but I wanted checked out having a lot of pain in these joints again that has been ongoing and I am trying to exercise more   ROS      Objective:  Physical Exam  Vascular status intact with inflammation pain of chronic nature 2nd and 3rd metatarsal phalangeal joints that is not responding anymore to injection treatment.  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  I did explain procedures I reviewed risk with Natalie Daniels and allowed her to read consent form going over alternative treatments complications.  Patient wants surgery and at this time signed consent form.  She understands no guarantee as far as results given that we will be working internally but she is comfortable with this fact.  I dispensed air fracture walker properly fitted to her lower leg and gave her instructions on practicing with the boot prior to surgery so she is comfortable with that at the time of surgery.  Understands total recovery can take upwards of 6 months and patient will schedule for outpatient procedure     Patient

## 2024-03-03 ENCOUNTER — Telehealth: Payer: Self-pay | Admitting: Podiatry

## 2024-03-03 NOTE — Telephone Encounter (Signed)
 DOS- 03/23/2024  2ND/3RD METATARSAL OSTEOTOMY LT- 71691  [employer]ET[employer] EFFECTIVE DATE- 11/16/2023  DEDUCTIBLE- $0 REMAINING- $0 FAMILY DEDUCTIBLE- $4200 REMAINING- $0 OOP- $6750 REMAINING- $4314.32 FAMILY OOP- $8400 REMAINING- $0 COINSURANCE- 20%  PER [employer] WEBSITE, NO PRIOR AUTH IS REQUIRED FOR CPT CODE 71691 (2 UNITS). DOCUMENTATION ATTACHED TO SURGERY CONSENT PACKET.

## 2024-03-04 ENCOUNTER — Ambulatory Visit: Attending: Medical-Surgical | Admitting: Audiologist

## 2024-03-04 DIAGNOSIS — H9193 Unspecified hearing loss, bilateral: Secondary | ICD-10-CM | POA: Diagnosis present

## 2024-03-04 DIAGNOSIS — H903 Sensorineural hearing loss, bilateral: Secondary | ICD-10-CM | POA: Insufficient documentation

## 2024-03-05 NOTE — Procedures (Signed)
  Outpatient Audiology and [facility] [address] [place], [place]  [address] [phone]  AUDIOLOGICAL  EVALUATION  NAME: Natalie Daniels     DOB:   22-Apr-1971      MRN: [id number]                                                                                     DATE: 03/05/2024     REFERENT: Willo Mini, NP STATUS: Outpatient DIAGNOSIS: Sensorineural Hearing Loss    History: Natalie Daniels was seen for an audiological evaluation to monitor hearing loss. Natalie Daniels was last seen 10/23/2023.  Test results are consistent with slight low-frequency hearing loss 250 through 1kHz bilaterally rising to normal, sloping to mild bilateral sensorineural hearing loss 3 through 8 kHz bilaterally.  Natalie Daniels was brought back today to monitor thresholds.   Previous history: Natalie Daniels denies pain, pressure, or tinnitus. She has a high pitched intermittent ring that started a few months ago. It is brief.  Natalie Daniels no history of hazardous noise exposure. No family history of hearing loss. She has trouble pressure change in her ears, she dreads flying due to the pain in her ears when they land.  Medical history shows no additional risk for hearing loss.  Evaluation:  Otoscopy showed a clear view of the tympanic membranes, bilaterally Tympanometry results were consistent with normal middle ear function, bilaterally   Audiometric testing was completed using Conventional Audiometry techniques with insert earphones and supraural headphones. Test results are consistent with slight decrease in thresholds by 10-15dB from 250-8kHz. Asymmetry with left ear worse by 10dB 4-8kHz  Results:  The test results were reviewed with Natalie Daniels. Her hearing has declined compared to four months ago. Natalie Daniels would benefit from a medical evaluation with Otolaryngology to be cautious.  Audiogram from today and baseline printed and provided to Natalie Daniels.    Recommendations: Audiometric testing recommended again in six months to  monitor hearing loss for progression. This can be done with [facility] or [facility]. Either was new referral would likely needed for coverage.  Recommend referral to Otolaryngology due to progression of hearing loss over four months with a low frequency sensorineural presentation.    39 minutes spent testing and counseling on results.   If you have any questions please feel free to contact me at [phone].  Lauraine Ka Stalnaker Au.D.  Audiologist   03/05/2024  12:35 PM  Cc: Willo Mini, NP

## 2024-03-15 ENCOUNTER — Ambulatory Visit: Admitting: Medical-Surgical

## 2024-03-15 ENCOUNTER — Encounter: Payer: Self-pay | Admitting: Medical-Surgical

## 2024-03-15 DIAGNOSIS — H9313 Tinnitus, bilateral: Secondary | ICD-10-CM

## 2024-03-15 DIAGNOSIS — H9193 Unspecified hearing loss, bilateral: Secondary | ICD-10-CM

## 2024-03-22 MED ORDER — HYDROCODONE-ACETAMINOPHEN 10-325 MG PO TABS: 1.0000 | ORAL_TABLET | Freq: Three times a day (TID) | ORAL | 0 refills | Status: AC | PRN

## 2024-03-22 NOTE — Addendum Note (Signed)
 Addended by: MAGDALEN PASCO RAMAN on: 03/22/2024 05:04 PM   Modules accepted: Orders

## 2024-03-23 DIAGNOSIS — M21542 Acquired clubfoot, left foot: Secondary | ICD-10-CM | POA: Diagnosis not present

## 2024-03-29 ENCOUNTER — Encounter: Payer: Self-pay | Admitting: Podiatry

## 2024-03-29 ENCOUNTER — Ambulatory Visit: Admitting: Podiatry

## 2024-03-29 ENCOUNTER — Ambulatory Visit (INDEPENDENT_AMBULATORY_CARE_PROVIDER_SITE_OTHER)

## 2024-03-29 VITALS — BP 139/82 | HR 74 | Temp 99.1°F

## 2024-03-29 DIAGNOSIS — M7752 Other enthesopathy of left foot: Secondary | ICD-10-CM | POA: Diagnosis not present

## 2024-03-29 DIAGNOSIS — Z9889 Other specified postprocedural states: Secondary | ICD-10-CM

## 2024-03-29 NOTE — Progress Notes (Signed)
 Patient presents for post-op visit today, POV # 1 DOS 03/23/24 LT 2ND AND 3RD SHORTENING OSTEOTOMY W SCREW  Doing fine, except sleeping at night has been complicated with the boot and the pillow. Switching between Tylenol and Advil  during the day and then taking Norco at night to help her sleep. It does not help her sleep but does relieve pain. .   Vital Signs: Today's Vitals   03/29/24 1543  BP: 139/82  Pulse: 74  Temp: 99.1 F (37.3 C)  TempSrc: Oral  PainSc: 0-No pain    Radiographs: [x]  Taken []  Not taken  Surgical Site Assessment:  - Dressing:  [x]  Minimal dry blood, intact []  Reinforced   [x]  Changed     -RN Notes: n/a  - Incision:  [x]  CDI (clean, dry, intact)  [x]  Mild erythema  []  Drainage noted   -RN Notes: n/a  - Swelling:  []  None  [x]  Mild  []  Moderate   []  Significant    - Bruising:  []  None  [x]  Present: lateral aspect of foot  - Sutures/staples:  [x]  Intact  []  Removed Today  []  Plan to remove at next visit    -Cast/Splint/Pins: [x]  None []  Intact  []  Removed []  Plan to remove at next visit []  Replaced  -Signs of infection:  [x]  None  []  Present - Describe: n/a  -DME:    [x]  AFW []  Surgical shoe []  Cast  []  Splint  -Walking status:  [x]  Full WB  []  Partial WB  []  NWB  -Utilizing device:  [x]  None []  Knee Scooter []  Crutches []  Wheelchair    DVT assessment:  [x]  Denies symptoms []  Chest pain/SOB []  Pain in calf/redness/warmth   Redressed DSD and ace wrap. Educated on signs of infection, proper dressing care, pain management, and weight bearing status. Patient will contact provider with any new or worsening symptoms. The provider assessed the patient today and reviewed instructions regarding plan of care.

## 2024-03-29 NOTE — Patient Instructions (Signed)

## 2024-03-29 NOTE — Progress Notes (Signed)
 Subjective:   Patient ID: Natalie Daniels, female   DOB: 53 y.o.   MRN: [id number]   HPI Patient seen by nurse doing well with surgery   ROS      Objective:  Physical Exam  Patient's left foot is healing well osteotomy intact wound edges well coapted     Assessment:  Doing well post osteotomy left 2nd and 3rd metatarsal     Plan:  H&P reviewed x-ray confirmed good position reappoint 2 weeks sterile dressing applied signed visit signed visit

## 2024-04-12 ENCOUNTER — Encounter

## 2024-04-16 ENCOUNTER — Encounter: Payer: Self-pay | Admitting: Podiatry

## 2024-04-23 ENCOUNTER — Encounter (INDEPENDENT_AMBULATORY_CARE_PROVIDER_SITE_OTHER): Payer: Self-pay

## 2024-04-23 ENCOUNTER — Ambulatory Visit (INDEPENDENT_AMBULATORY_CARE_PROVIDER_SITE_OTHER)

## 2024-04-23 VITALS — BP 123/82 | HR 81 | Temp 98.0°F | Ht 68.0 in | Wt 171.0 lb

## 2024-04-23 DIAGNOSIS — H6993 Unspecified Eustachian tube disorder, bilateral: Secondary | ICD-10-CM

## 2024-04-23 DIAGNOSIS — H903 Sensorineural hearing loss, bilateral: Secondary | ICD-10-CM | POA: Diagnosis not present

## 2024-04-23 DIAGNOSIS — J301 Allergic rhinitis due to pollen: Secondary | ICD-10-CM

## 2024-04-23 NOTE — Progress Notes (Signed)
 HPI:  Discussed the use of AI scribe software for clinical note transcription with the patient, who gave verbal consent to proceed.  History of Present Illness Natalie Daniels is a 53 year old female who presents with ringing in the ears and hearing loss.  She has been experiencing ringing in the ears and was informed by an audiologist of some hearing loss between May and September. She seeks evaluation for her ear symptoms.  She has a lifelong history of significant ear pressure and pain during airplane descents. She also experiences ear pressure changes when driving in the mountains.  No ear drainage, recurrent ear infections, or surgeries on the ears or nose. She experiences normal allergy symptoms, primarily due to ragweed, and takes allergy medication during summer and spring due to tree pollen. Allergy testing showed sensitivity to hay and ragweed. She uses Flonase Sensimist daily to manage her allergies, which she finds effective. She has tried azelastine but found it ineffective.  Her family noticed her hearing difficulties, prompting her to seek an audiologist's evaluation. She needs to increase the TV volume to 25 to hear properly but manages well at work using a headset. She is concerned about using hearing aids due to potential discomfort from increased volume.  There is a family history of dementia, which concerns her due to its association with hearing loss.     PMH/Meds/All/SocHx/FamHx/ROS: Past Medical History:  Diagnosis Date   Anxiety    Constipation    Headache    Plantar fasciitis    Rosacea    Past Surgical History:  Procedure Laterality Date   ANTERIOR FUSION LUMBAR SPINE     BACK SURGERY     CESAREAN SECTION     COLONOSCOPY W/ POLYPECTOMY     age 21   No family history of bleeding disorders, wound healing problems or difficulty with anesthesia.  Social Connections: Socially Integrated (02/19/2024)   Received from [facility]   Social Network    How would  you rate your social network (family, work, friends)?: Good participation with social networks  Recent Concern: Social Connections - Moderately Isolated (01/08/2024)   Social Connection and Isolation Panel    Frequency of Communication with Friends and Family: Once a week    Frequency of Social Gatherings with Friends and Family: Once a week    Attends Religious Services: 1 to 4 times per year    Active Member of [employer] or Organizations: No    Attends Engineer, Structural: Not on file    Marital Status: Married    Current Outpatient Medications:    famotidine  (PEPCID ) 20 MG tablet, Take 1 tablet (20 mg total) by mouth 2 (two) times daily as needed for heartburn or indigestion., Disp: 180 tablet, Rfl: 1   fexofenadine (ALLEGRA) 60 MG tablet, Take 60 mg by mouth 2 (two) times daily., Disp: , Rfl:    HYDROcodone-acetaminophen (NORCO/VICODIN) 5-325 MG tablet, Take 1 tablet by mouth every 6 (six) hours as needed for moderate pain (pain score 4-6)., Disp: , Rfl:    metroNIDAZOLE (METROGEL) 1 % gel, metronidazole 1 % topical gel, Disp: , Rfl:    Multiple Vitamin (MULTIVITAMIN) capsule, Take 1 capsule by mouth daily., Disp: , Rfl:    nystatin  (MYCOSTATIN ) 100000 UNIT/ML suspension, Take 5 mLs (500,000 Units total) by mouth 4 (four) times daily. Swish for 30 seconds and spit out., Disp: 473 mL, Rfl: 0   POTASSIUM PO, Take 1 tablet by mouth daily., Disp: , Rfl:    Probiotic  Product (PROBIOTIC DAILY PO), Take by mouth., Disp: , Rfl:    Sulfacetamide Sodium, Acne, 10 % LOTN, 1 APPLICATION TWICE A DAY EXTERNALLY 30 DAYS, Disp: , Rfl:  A complete ROS was performed with pertinent positives/negatives noted in the HPI. The remainder of the ROS are negative.   Physical Exam:  BP 123/82 (BP Location: Right Arm, Patient Position: Sitting, Cuff Size: Normal)   Pulse 81   Temp 98 F (36.7 C)   Ht 5' 8 (1.727 m)   Wt 171 lb (77.6 kg)   SpO2 99%   BMI 26.00 kg/m  General: Well developed, well  nourished. No acute distress.  Head/Face: Normocephalic. No sinus tenderness. Facial nerve intact and equal bilaterally. No facial lacerations. Eyes: PERRL, no scleral icterus or conjunctival hemorrhage. EOMI. Ears: No gross deformity. Normal external canal. Tympanic membrane in tact bilaterally Hearing: Normal speech reception.  Nose: No gross deformity or lesions. No purulent discharge. No turbinate hypertrophy. Mouth/Oropharynx: Lips without any lesions. Dentition good. No mucosal lesions within the oropharynx. No tonsillar enlargement, exudate, or lesions. Pharyngeal walls symmetrical. Uvula midline. Tongue midline without lesions. Larynx: See TFL if applicable Nasopharynx: See TFL if applicable Neck: Trachea midline. No masses. No thyromegaly or nodules palpated. No crepitus. Lymphatic: No lymphadenopathy in the neck. Respiratory: No stridor or distress. Room air. Cardiovascular: Regular rate and rhythm. Extremities: No edema or cyanosis. Warm and well-perfused. Skin: No scars or lesions on face or neck. Neurologic: CN II-XII grossly intact. Moving all extremities without gross abnormality. Other:  Independent Review of Additional Tests or Records: 03/04/2024 - mild bilateral SNHL, Type A tympanograms bilaterally  Procedures: None  Impression & Plans:  Assessment and Plan Assessment & Plan Eustachian tube dysfunction with associated hearing loss Chronic dysfunction with pain and pressure during altitude changes.  - Recommended Afrin nasal spray before flights. - Advised earplugs and chewing gum during flights  Allergic rhinitis Chronic rhinitis with symptoms triggered by ragweed and tree pollen. Allergy testing confirmed sensitivity. Symptoms managed with Flonase Sensimist. - Continue daily Flonase Sensimist.  Hearing Loss - Patient states she is able to function socially and does not feel she needs hearing aids at this time - Recommend repeat audiogram in 1  year  Follow-up in 1 year with audiogram.  Adah Malkin, DO [facility]

## 2024-04-26 ENCOUNTER — Ambulatory Visit: Admitting: Podiatry

## 2024-04-26 DIAGNOSIS — M7752 Other enthesopathy of left foot: Secondary | ICD-10-CM

## 2024-04-26 NOTE — Progress Notes (Unsigned)
 Patient presents for post-op visit today, POV # 2 DOS 03/23/24 LT 2ND AND 3RD SHORTENING OSTEOTOMY W SCREW  Had a lot of nerve pain since last time, primarily in the heel. Pressure on top of the foot. Other than that, doing fine. Wearing the surgical shoe..  RN Notes: n/a  Vital Signs: Today's Vitals   04/26/24 1631  PainSc: 0-No pain      Radiographs: []  Taken [x]  Not taken  Surgical Site Assessment:  - Dressing:  [x]  Minimal dry blood, intact []  Reinforced   []  Changed     -RN Notes: n/a  - Incision:  [x]  CDI (clean, dry, intact)  []  Mild erythema  []  Drainage noted   -RN Notes: n/a  - Swelling:  []  None  [x]  Mild  []  Moderate   []  Significant     -RN Notes: n/a  - Bruising:  []  None  [x]  Present: lateral aspect ankle and plantar aspect foot   - Sutures/Staples:  []  None []  Intact  []  Removed Today  []  Plan to remove at next visit     -RN Notes: absorbable sutures used.   -Cast/Splint/Pins: [x]  None []  Intact []  Removed Today []  Plan to remove at next visit []  Replaced  -Signs of infection:  [x]  None  []  Present - Describe: n/a  -DME:    []  None []  AFW [x]  Surgical shoe []  Cast  []  Splint  -Walking status:  [x]  Full WB  []  Partial WB  []  NWB  -Utilizing device:  [x]  None []  Knee Scooter []  Crutches []  Wheelchair    DVT assessment:  [x]  Denies symptoms []  Chest pain/SOB []  Pain in calf/redness/warmth   Redressed DSD and ace wrap. Educated on signs of infection, proper dressing care, pain management, and weight bearing status. Patient will contact provider with any new or worsening symptoms. The provider assessed the patient today and reviewed instructions regarding plan of care.  --  53 year old female presents to the office today with the above concerns.  I also independently evaluated her.  She does get some nerve pain of the right foot.  Still concerned about the swelling.  She does the incisions healed and scab is present.  No drainage or pus that she reports.   She can keep an Ace bandage on.  Skin is present on the incision.  There is a rim of erythema which I think is more from inflammation as opposed to infection.  There is no drainage or pus or increase in temperature.  There is edema present to the foot.  There is no fluctuation or crepitation.  There is palpation at surgical site.  Given the swelling I would recommend continuing the surgical shoe for now and continue compression, elevation.  As she feels more comfortable she can then transition to a regular shoe as able.  Given the swelling discomfort her follow back up with Dr. Magdalen in a couple weeks for reevaluation they are encouraged to call any question concerns or any changes in the meantime. X-ray next appointment  Return in about 2 weeks (around 05/10/2024) for post-op with Dr. Magdalen .  Donnice JONELLE Fees DPM

## 2024-05-10 ENCOUNTER — Ambulatory Visit

## 2024-05-10 ENCOUNTER — Ambulatory Visit (INDEPENDENT_AMBULATORY_CARE_PROVIDER_SITE_OTHER): Admitting: Podiatry

## 2024-05-10 ENCOUNTER — Encounter: Payer: Self-pay | Admitting: Podiatry

## 2024-05-10 DIAGNOSIS — M7752 Other enthesopathy of left foot: Secondary | ICD-10-CM | POA: Diagnosis not present

## 2024-05-11 ENCOUNTER — Encounter: Payer: Self-pay | Admitting: Podiatry

## 2024-05-13 NOTE — Progress Notes (Signed)
 Subjective:   Patient ID: Natalie Daniels, female   DOB: 53 y.o.   MRN: [id number]   HPI Patient states overall doing well but still gets some swelling in her foot   ROS      Objective:  Physical Exam  Neurovascular status intact negative Toula' sign noted left foot shows mild swelling but overall good with what appears to be reduction of the discomfort plantar     Assessment:  Patient healing well after having osteotomy 2nd and 3rd metatarsal left foot     Plan:  H&P reviewed and at this point reviewed x-ray.  I applied sterile dressing discussed elevation compression and patient will be seen back approximate 4 weeks  X-rays indicate osteotomies are healing well screws in place no signs of movement

## 2024-06-14 ENCOUNTER — Encounter: Payer: Self-pay | Admitting: Podiatry

## 2024-06-14 ENCOUNTER — Ambulatory Visit (INDEPENDENT_AMBULATORY_CARE_PROVIDER_SITE_OTHER): Admitting: Podiatry

## 2024-06-14 ENCOUNTER — Ambulatory Visit (INDEPENDENT_AMBULATORY_CARE_PROVIDER_SITE_OTHER)

## 2024-06-14 DIAGNOSIS — M7752 Other enthesopathy of left foot: Secondary | ICD-10-CM

## 2024-06-14 NOTE — Progress Notes (Signed)
 Subjective:   Patient ID: Natalie Daniels, female   DOB: 53 y.o.   MRN: [id number]   HPI Patient presents overall doing well and pleased but has a full feeling in her foot and feels swollen this summer   ROS      Objective:  Physical Exam  Neurovascular status intact negative Toula' sign noted wound edges coapted well edema of the normal nature for this.  Postop significant reduction of sharp     Assessment:  Doing well post osteotomies left second third metatarsal     Plan:  X-rays taken reviewed explained it is normal to still have some discomfort associated with condition recommend continued elevation compression immobilization and reappoint to recheck again in approximate 6 to 8 weeks if necessary  X-rays indicate screws intact 2nd-3rd metatarsal left no indication of movement or other pathology
